# Patient Record
Sex: Female | Born: 1995 | Race: Black or African American | Hispanic: No | Marital: Single | Smoking: Former smoker
Health system: Southern US, Community
[De-identification: ages and names within clinical notes are randomized; demographics above are authoritative.]

## PROBLEM LIST (undated history)

## (undated) ENCOUNTER — Ambulatory Visit: Admission: EM | Source: Home / Self Care

## (undated) DIAGNOSIS — R011 Cardiac murmur, unspecified: Secondary | ICD-10-CM

## (undated) DIAGNOSIS — E0590 Thyrotoxicosis, unspecified without thyrotoxic crisis or storm: Secondary | ICD-10-CM

## (undated) DIAGNOSIS — F419 Anxiety disorder, unspecified: Secondary | ICD-10-CM

## (undated) DIAGNOSIS — J189 Pneumonia, unspecified organism: Secondary | ICD-10-CM

## (undated) DIAGNOSIS — D649 Anemia, unspecified: Secondary | ICD-10-CM

## (undated) HISTORY — DX: Cardiac murmur, unspecified: R01.1

## (undated) HISTORY — DX: Pneumonia, unspecified organism: J18.9

## (undated) HISTORY — DX: Thyrotoxicosis, unspecified without thyrotoxic crisis or storm: E05.90

## (undated) HISTORY — DX: Anxiety disorder, unspecified: F41.9

## (undated) HISTORY — DX: Anemia, unspecified: D64.9

---

## 2019-07-14 ENCOUNTER — Ambulatory Visit: Payer: Medicaid Other | Admitting: Certified Nurse Midwife

## 2019-08-04 ENCOUNTER — Ambulatory Visit: Payer: Medicaid Other | Admitting: Medical

## 2019-09-14 ENCOUNTER — Other Ambulatory Visit: Payer: Self-pay

## 2019-09-14 ENCOUNTER — Encounter: Payer: Self-pay | Admitting: Advanced Practice Midwife

## 2019-09-14 ENCOUNTER — Ambulatory Visit (INDEPENDENT_AMBULATORY_CARE_PROVIDER_SITE_OTHER): Payer: Medicaid Other | Admitting: Advanced Practice Midwife

## 2019-09-14 ENCOUNTER — Other Ambulatory Visit (HOSPITAL_COMMUNITY)
Admission: RE | Admit: 2019-09-14 | Discharge: 2019-09-14 | Disposition: A | Discharge status: Home / Self Care | Payer: Medicaid Other | Source: Ambulatory Visit | Attending: Advanced Practice Midwife | Admitting: Advanced Practice Midwife

## 2019-09-14 VITALS — BP 118/64 | HR 73 | Temp 98.3°F | Ht 64.0 in | Wt 134.0 lb

## 2019-09-14 DIAGNOSIS — Z0000 Encounter for general adult medical examination without abnormal findings: Secondary | ICD-10-CM | POA: Diagnosis not present

## 2019-09-14 DIAGNOSIS — R102 Pelvic and perineal pain unspecified side: Secondary | ICD-10-CM | POA: Insufficient documentation

## 2019-09-14 DIAGNOSIS — Z113 Encounter for screening for infections with a predominantly sexual mode of transmission: Secondary | ICD-10-CM | POA: Diagnosis not present

## 2019-09-14 DIAGNOSIS — Z975 Presence of (intrauterine) contraceptive device: Secondary | ICD-10-CM | POA: Insufficient documentation

## 2019-09-14 DIAGNOSIS — N939 Abnormal uterine and vaginal bleeding, unspecified: Secondary | ICD-10-CM

## 2019-09-14 DIAGNOSIS — N921 Excessive and frequent menstruation with irregular cycle: Secondary | ICD-10-CM | POA: Insufficient documentation

## 2019-09-14 DIAGNOSIS — Z3009 Encounter for other general counseling and advice on contraception: Secondary | ICD-10-CM

## 2019-09-14 MED ORDER — NORGESTIMATE-ETH ESTRADIOL 0.25-35 MG-MCG PO TABS: 1.0000 | ORAL_TABLET | Freq: Every day | ORAL | 2 refills | Status: DC

## 2019-09-14 NOTE — Progress Notes (Signed)
Subjective:     Brianna Lewis is a 23 y.o. female here for a routine exam.  Current complaints: abnormal bleeding x 3 months with bleeding daily x 1 month. Bleeding is not heavy most days but is constant, requiring tampons daily.  This is associated with vaginal irritation. She desires STD screening today but denies any known risks.  She has Nexplanon in place since 2017, due to be replaced in 2 months.  She has used Nexplanon for contraception x 6 years.  She reports one prior episode of AUB, resolved with OCPs. She reports some intermittent abdominal cramping and some intermittent body aches x 2-3 months associated with the abnormal bleeding. She denies any fever/chills/sore throat/change in taste or smell or exposure to COVID.      Gynecologic History Patient's last menstrual period was 08/14/2019. Contraception: Nexplanon Last Pap: n/a due to young age.  Obstetric History OB History  Gravida Para Term Preterm AB Living  0 0 0 0 0 0  SAB TAB Ectopic Multiple Live Births  0 0 0 0 0     The following portions of the patient's history were reviewed and updated as appropriate: allergies, current medications, past family history, past medical history, past social history, past surgical history and problem list.  Review of Systems Pertinent items noted in HPI and remainder of comprehensive ROS otherwise negative.    Objective:   BP 118/64   Pulse 73   Temp 98.3 F (36.8 C)   Ht 5\' 4"  (1.626 m)   Wt 134 lb (60.8 kg)   LMP 08/14/2019   BMI 23.00 kg/m   VS reviewed, nursing note reviewed,  Constitutional: well developed, well nourished, no distress HEENT: normocephalic CV: normal rate Pulm/chest wall: normal effort Breast Exam:  right breast normal without mass, skin or nipple changes or axillary nodes, left breast normal without mass, skin or nipple changes or axillary nodes Abdomen: soft Neuro: alert and oriented x 3 Skin: warm, dry Psych: affect normal Pelvic exam: SSE  deferred Bimanual exam: Cervix 0/long/high, firm, anterior, neg CMT, uterus nontender, nonenlarged, adnexa without tenderness, enlargement, or mass      Assessment/Plan:   1. Abnormal uterine bleeding --Likely r/t Nexplanon but pt with adnexal pain, with normal bimanual exam.  Will order outpatient [place] for evaluation. - US PELVIC COMPLETE WITH TRANSVAGINAL; Future - norgestimate-ethinyl estradiol (ORTHO-CYCLEN) 0.25-35 MG-MCG tablet; Take 1 tablet by mouth daily.  Dispense: 1 Package; Refill: 2  2. Screening examination for STD (sexually transmitted disease) - Cervicovaginal ancillary only( [name]) - HepB+HepC+HIV Panel - RPR - HIV antibody (with reflex)  3. Encounter for counseling regarding contraception --Pt happy with Nexplanon. Discussed UD as alternative, possibly with less bleeding.  Pt unsure. Will consider if OCPs do not resolve symptoms.   4. Breakthrough bleeding on Nexplanon --Most likely cause of AUB is Nexplanon.  Will treat with OCPs x 1-3 months. Pt to f/u in office in 2 months.   --Pt smoker, no other risk factors for DVT/PE.  Short course of OCPs pose minimal risk but s/sx/reasons to seek care discussed with pt today.  Contraception: Nexplanon  Follow up in: 2 months or as needed.   Fatima Blank, CNM 3:58 PM

## 2019-09-14 NOTE — Progress Notes (Signed)
New GYN presents for AEX.  C/o vaginal bleeding since 08/14/19, lower abdominal pain 7-9/10 x 3 months.  Denies discharge, fever, chills. Has Nexplanon for [facility].  PHQ-9=11

## 2019-09-14 NOTE — Progress Notes (Deleted)
Subjective:     Brianna Lewis is a 23 y.o. female here for a routine exam.  Current complaints: ***.    Normal/last known normal Describe current cycles/when did they change? Pain w/cycles?  Sexual hx: Active? Form of contraception History of std Okay screening? Pain w/?  Personal health questionnaire reviewed: yes.  Do you have a primary care provider? *** How many times per week do you exercise? *** Do you feel safe at home? *** Has anyone hit, slapped, or kicked you recently? *** Do you feel sad, tired, or upset most days or are you mostly happy with life? ***  Gynecologic History Patient's last menstrual period was 08/14/2019. Contraception: {TXMIWO:0321}  Not applicable Last Pap: ***. Results were: {norm/abn:16337} Last mammogram: ***. Results were: {norm/abn:16337}  Obstetric History OB History  No obstetric history on file.      {Common ambulatory SmartLinks:19316}  Review of Systems {ros; complete:30496}    Objective:    {exam; complete:18323}    Assessment:    Healthy female exam.    Plan:    {plan:19193}

## 2019-09-16 LAB — CERVICOVAGINAL ANCILLARY ONLY
Bacterial vaginitis: POSITIVE — AB
Candida vaginitis: POSITIVE — AB
Chlamydia: POSITIVE — AB
Neisseria Gonorrhea: NEGATIVE
Trichomonas: NEGATIVE

## 2019-09-16 LAB — HEPB+HEPC+HIV PANEL
HIV Screen 4th Generation wRfx: NONREACTIVE
Hep B C IgM: NEGATIVE
Hep B Core Total Ab: NEGATIVE
Hep B E Ab: NEGATIVE
Hep B E Ag: NEGATIVE
Hep B Surface Ab, Qual: NONREACTIVE
Hep C Virus Ab: <0.1 s/co ratio (ref 0.0–0.9)
Hepatitis B Surface Ag: NEGATIVE

## 2019-09-16 LAB — RPR: RPR Ser Ql: NONREACTIVE

## 2019-09-21 ENCOUNTER — Other Ambulatory Visit: Payer: Self-pay | Admitting: Advanced Practice Midwife

## 2019-09-21 ENCOUNTER — Telehealth: Payer: Self-pay

## 2019-09-21 DIAGNOSIS — B373 Candidiasis of vulva and vagina: Secondary | ICD-10-CM

## 2019-09-21 DIAGNOSIS — A749 Chlamydial infection, unspecified: Secondary | ICD-10-CM

## 2019-09-21 DIAGNOSIS — B3731 Acute candidiasis of vulva and vagina: Secondary | ICD-10-CM

## 2019-09-21 MED ORDER — AZITHROMYCIN 500 MG PO TABS: 1000.0000 mg | ORAL_TABLET | Freq: Once | ORAL | 1 refills | Status: AC

## 2019-09-21 MED ORDER — FLUCONAZOLE 150 MG PO TABS: 150.0000 mg | ORAL_TABLET | Freq: Once | ORAL | 0 refills | Status: AC

## 2019-09-21 NOTE — Telephone Encounter (Signed)
TC to pt to make aware of results  Pt notifed and voiced understanding Pt made aware No unproctected intercourse at this time and will need TOC appt scheduled Pt voiced understanding and states she has no questions. STD form faxed to [facility]

## 2019-09-21 NOTE — Telephone Encounter (Signed)
-----   Message from Elvera Maria, CNM sent at 09/21/2019  1:13 PM EDT ----- Regarding: positive chlamydia test This nonpregnant pt was seen for abnormal bleeding with Nexplanon. She is positive for chlamydia. She also has yeast.  This may be contributing to her irregular bleeding.  I sent azithromycin 1000 mg and Diflucan 150 mg to her pharmacy.  Her partner(s) will need treatment for chlamydia also.  Please call to let her know. Thank you.

## 2019-09-29 ENCOUNTER — Other Ambulatory Visit: Payer: Self-pay

## 2019-09-29 ENCOUNTER — Ambulatory Visit (HOSPITAL_COMMUNITY)
Admission: RE | Admit: 2019-09-29 | Discharge: 2019-09-29 | Disposition: A | Discharge status: Home / Self Care | Payer: Medicaid Other | Source: Ambulatory Visit | Attending: Advanced Practice Midwife | Admitting: Advanced Practice Midwife

## 2019-09-29 DIAGNOSIS — N939 Abnormal uterine and vaginal bleeding, unspecified: Secondary | ICD-10-CM | POA: Diagnosis present

## 2019-11-16 ENCOUNTER — Ambulatory Visit (INDEPENDENT_AMBULATORY_CARE_PROVIDER_SITE_OTHER): Payer: Medicaid Other | Admitting: Advanced Practice Midwife

## 2019-11-16 ENCOUNTER — Other Ambulatory Visit: Payer: Self-pay

## 2019-11-16 VITALS — BP 130/78 | HR 101 | Wt 140.0 lb

## 2019-11-16 DIAGNOSIS — Z30017 Encounter for initial prescription of implantable subdermal contraceptive: Secondary | ICD-10-CM

## 2019-11-16 DIAGNOSIS — Z3046 Encounter for surveillance of implantable subdermal contraceptive: Secondary | ICD-10-CM

## 2019-11-16 MED ORDER — ETONOGESTREL 68 MG ~~LOC~~ IMPL
68.0000 mg | DRUG_IMPLANT | Freq: Once | SUBCUTANEOUS | Status: AC
  Administered 2019-11-16: 68 mg via SUBCUTANEOUS

## 2019-11-16 NOTE — Progress Notes (Signed)
GYNECOLOGY CLINIC PROCEDURE NOTE  Brianna Lewis is a 23 y.o. G0P0000 here for Nexplanon removal and Nexplanon insertion.  Pt had visit on 09/14/19 with breakthrough bleeding and took short course of OCPs. Bleeding resolved and she desires to continue Nexplanon for contraception.  No other gynecologic concerns.  Nexplanon Removal and Insertion  Patient identified, informed consent performed, consent signed.   Patient does understand that irregular bleeding is a very common side effect of this medication. She was advised to have backup contraception for one week after replacement of the implant. Pregnancy test in clinic today was negative.  Appropriate time out taken. Implanon site identified. Area prepped in usual sterile fashon. One ml of 1% lidocaine was used to anesthetize the area at the distal end of the implant. A small stab incision was made right beside the implant on the distal portion. The Nexplanon rod was grasped using hemostats and removed without difficulty. There was minimal blood loss. There were no complications. Area was then injected with 3 ml of 1 % lidocaine. She was re-prepped with betadine, Nexplanon removed from packaging, Device confirmed in needle, then inserted full length of needle and withdrawn per handbook instructions. Nexplanon was able to palpated in the patient's arm; patient palpated the insert herself.  There was minimal blood loss. Patient insertion site covered with guaze and a pressure bandage to reduce any bruising. The patient tolerated the procedure well and was given post procedure instructions.  She was advised to have backup contraception for one week.    Exp:  Nov 28, 20200 Lot 1:  [id number]  Fatima Blank, CNM 4:57 PM

## 2019-11-23 ENCOUNTER — Other Ambulatory Visit: Payer: Self-pay | Admitting: Advanced Practice Midwife

## 2019-11-23 DIAGNOSIS — N939 Abnormal uterine and vaginal bleeding, unspecified: Secondary | ICD-10-CM

## 2019-11-23 DIAGNOSIS — Z975 Presence of (intrauterine) contraceptive device: Secondary | ICD-10-CM

## 2019-11-23 DIAGNOSIS — N921 Excessive and frequent menstruation with irregular cycle: Secondary | ICD-10-CM

## 2019-11-23 MED ORDER — NORGESTIMATE-ETH ESTRADIOL 0.25-35 MG-MCG PO TABS: 1.0000 | ORAL_TABLET | Freq: Every day | ORAL | 2 refills | Status: DC

## 2019-12-01 ENCOUNTER — Telehealth: Payer: Self-pay | Admitting: Advanced Practice Midwife

## 2019-12-01 DIAGNOSIS — N898 Other specified noninflammatory disorders of vagina: Secondary | ICD-10-CM

## 2019-12-01 DIAGNOSIS — Z202 Contact with and (suspected) exposure to infections with a predominantly sexual mode of transmission: Secondary | ICD-10-CM

## 2019-12-01 MED ORDER — FLUCONAZOLE 150 MG PO TABS: 150.0000 mg | ORAL_TABLET | Freq: Once | ORAL | 0 refills | Status: AC

## 2019-12-01 MED ORDER — AZITHROMYCIN 500 MG PO TABS: 1000.0000 mg | ORAL_TABLET | Freq: Once | ORAL | 1 refills | Status: AC

## 2019-12-01 MED ORDER — TERCONAZOLE 0.4 % VA CREA: 1.0000 | TOPICAL_CREAM | Freq: Every day | VAGINAL | 0 refills | Status: DC

## 2019-12-01 NOTE — Telephone Encounter (Signed)
duplicate

## 2020-01-28 ENCOUNTER — Ambulatory Visit: Payer: Medicaid Other

## 2020-02-02 ENCOUNTER — Ambulatory Visit: Payer: Medicaid Other

## 2020-09-23 ENCOUNTER — Encounter: Payer: Self-pay | Admitting: Advanced Practice Midwife

## 2020-09-23 ENCOUNTER — Other Ambulatory Visit: Payer: Self-pay

## 2020-09-23 ENCOUNTER — Other Ambulatory Visit (HOSPITAL_COMMUNITY)
Admission: RE | Admit: 2020-09-23 | Discharge: 2020-09-23 | Disposition: A | Discharge status: Home / Self Care | Payer: Medicaid Other | Source: Ambulatory Visit | Attending: Advanced Practice Midwife | Admitting: Advanced Practice Midwife

## 2020-09-23 ENCOUNTER — Ambulatory Visit (INDEPENDENT_AMBULATORY_CARE_PROVIDER_SITE_OTHER): Payer: Medicaid Other | Admitting: Advanced Practice Midwife

## 2020-09-23 VITALS — BP 134/73 | HR 73 | Wt 146.0 lb

## 2020-09-23 DIAGNOSIS — N898 Other specified noninflammatory disorders of vagina: Secondary | ICD-10-CM | POA: Diagnosis present

## 2020-09-23 DIAGNOSIS — R102 Pelvic and perineal pain: Secondary | ICD-10-CM | POA: Diagnosis present

## 2020-09-23 DIAGNOSIS — Z01419 Encounter for gynecological examination (general) (routine) without abnormal findings: Secondary | ICD-10-CM | POA: Diagnosis not present

## 2020-09-23 DIAGNOSIS — Z113 Encounter for screening for infections with a predominantly sexual mode of transmission: Secondary | ICD-10-CM | POA: Diagnosis present

## 2020-09-23 DIAGNOSIS — B373 Candidiasis of vulva and vagina: Secondary | ICD-10-CM

## 2020-09-23 DIAGNOSIS — Z975 Presence of (intrauterine) contraceptive device: Secondary | ICD-10-CM

## 2020-09-23 DIAGNOSIS — B3731 Acute candidiasis of vulva and vagina: Secondary | ICD-10-CM

## 2020-09-23 MED ORDER — FLUCONAZOLE 150 MG PO TABS: 150.0000 mg | ORAL_TABLET | Freq: Once | ORAL | 0 refills | Status: AC

## 2020-09-23 NOTE — Progress Notes (Signed)
Subjective:     Trixy Loyola is a 24 y.o. female here at [facility] for a routine exam.  Current complaints: vaginal itching with white discharge, pain sometimes during intercourse, cramping menstrual pain not related to menses and irregular but frequent light bleeding with Nexplanon in place.  Personal health questionnaire reviewed: yes.  Do you have a primary care provider? yes Do you feel safe at home? yes    Office Visit from 09/23/2020 in [facility]  PHQ-2 Total Score 2       Gynecologic History Patient's last menstrual period was 09/02/2020 (approximate). Contraception: Nexplanon Last Pap: n/a.  Last mammogram: n/a  Obstetric History OB History  Gravida Para Term Preterm AB Living  0 0 0 0 0 0  SAB TAB Ectopic Multiple Live Births  0 0 0 0 0     The following portions of the patient's history were reviewed and updated as appropriate: allergies, current medications, past family history, past medical history, past social history, past surgical history and problem list.  Review of Systems Pertinent items noted in HPI and remainder of comprehensive ROS otherwise negative.    Objective:   BP 134/73   Pulse 73   Wt 146 lb (66.2 kg)   LMP 09/02/2020 (Approximate)   BMI 25.06 kg/m  VS reviewed, nursing note reviewed,  Constitutional: well developed, well nourished, no distress HEENT: normocephalic CV: normal rate Pulm/chest wall: normal effort Breast Exam:  Deferred with low risks and shared decision making, discussed recommendation to start mammogram between 61-50 yo Abdomen: soft Neuro: alert and oriented x 3 Skin: warm, dry Psych: affect normal Pelvic exam:Performed: Cervix pink, visually closed, without lesion, moderate amount thick white discharge, vaginal walls and external genitalia normal with some mild erythema of labia minora/perineum and single tiny microtear at perineum that is painful to palpation Bimanual exam: Cervix  0/long/high, firm, anterior, neg CMT, uterus nontender, nonenlarged, adnexa without tenderness, enlargement, or mass       Assessment/Plan:  1. Screen for STD (sexually transmitted disease) - Cervicovaginal ancillary only( [place]) - HIV antibody (with reflex) - RPR - Hepatitis C antibody - Hepatitis B surface antigen  2. Well woman exam with routine gynecological exam --screening for cervical cancer today, pt has never had Pap, now age 47 - Cytology - PAP( [place])  3. Vaginal discharge  - Cervicovaginal ancillary only( [place])  4. Itching in the vaginal area  - Cervicovaginal ancillary only( [place])  5. Pelvic pain in female --Irregular cramping and irregular bleeding with Nexplanon.  Pain may be related to vaginal infection or Nexplanon.  Will treat yeast, await STD results, and pt encouraged to use NSAIDs for pain. --F/U in 3 months - Cervicovaginal ancillary only( [place])  6. Nexplanon in place   7. Vaginal pain --Pt with small tear on perineal area, painful to touch only. - Herpes simplex virus culture  8. Vaginal candidiasis --Clinical exam with evidence of yeast and pt with itching.  Will initiate treatment today with Diflucan, labs to result over the weekend. - fluconazole (DIFLUCAN) 150 MG tablet; Take 1 tablet (150 mg total) by mouth once for 1 dose.  Dispense: 1 tablet; Refill: 0    Follow up in: 3 months or as needed.   Sharen Counter, CNM 11:13 AM

## 2020-09-23 NOTE — Patient Instructions (Signed)

## 2020-09-23 NOTE — Progress Notes (Signed)
GYN presents for AEX/PAP/STD Screening. C/o pelvic pains/cramps 6-7/10, tenderness in breast, vaginal discharge, itching, irritation inside the vagina during sex.  PHQ-9=5

## 2020-09-24 LAB — RPR: RPR Ser Ql: NONREACTIVE

## 2020-09-24 LAB — HEPATITIS C ANTIBODY: Hep C Virus Ab: <0.1 s/co ratio (ref 0.0–0.9)

## 2020-09-24 LAB — HIV ANTIBODY (ROUTINE TESTING W REFLEX): HIV Screen 4th Generation wRfx: NONREACTIVE

## 2020-09-24 LAB — HEPATITIS B SURFACE ANTIGEN: Hepatitis B Surface Ag: NEGATIVE

## 2020-09-26 LAB — CERVICOVAGINAL ANCILLARY ONLY
Bacterial Vaginitis (gardnerella): POSITIVE — AB
Candida Glabrata: NEGATIVE
Candida Vaginitis: POSITIVE — AB
Chlamydia: NEGATIVE
Comment: NEGATIVE
Comment: NEGATIVE
Comment: NEGATIVE
Comment: NEGATIVE
Comment: NEGATIVE
Comment: NORMAL
Neisseria Gonorrhea: NEGATIVE
Trichomonas: NEGATIVE

## 2020-09-26 LAB — HERPES SIMPLEX VIRUS CULTURE

## 2020-09-27 ENCOUNTER — Encounter: Payer: Self-pay | Admitting: Advanced Practice Midwife

## 2020-09-27 DIAGNOSIS — R87610 Atypical squamous cells of undetermined significance on cytologic smear of cervix (ASC-US): Secondary | ICD-10-CM | POA: Insufficient documentation

## 2020-09-27 LAB — CYTOLOGY - PAP
Comment: NEGATIVE
Diagnosis: UNDETERMINED — AB
High risk HPV: NEGATIVE

## 2020-12-05 IMAGING — US US PELVIS COMPLETE WITH TRANSVAGINAL
1 series · 15 of 25 positions shown · non-contrast
Comparison: None

CLINICAL DATA: Abnormal uterine bleeding, adnexal pain



[Series 1: us pelvis complete with transvaginal · 15 of 56 slices shown]
[im 1/56]
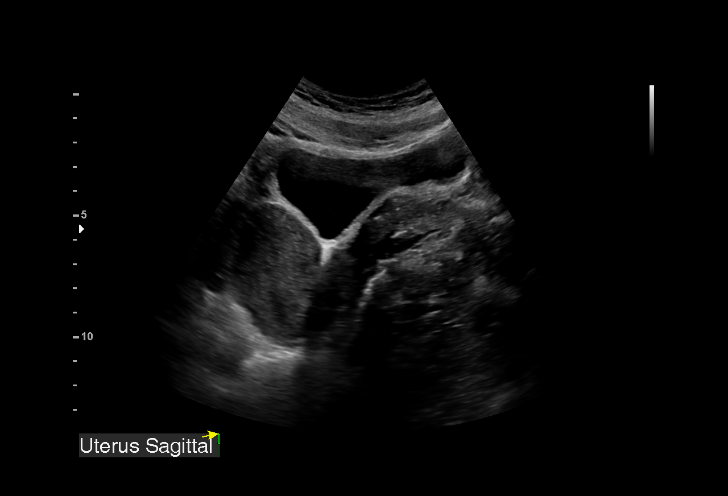
[im 5/56]
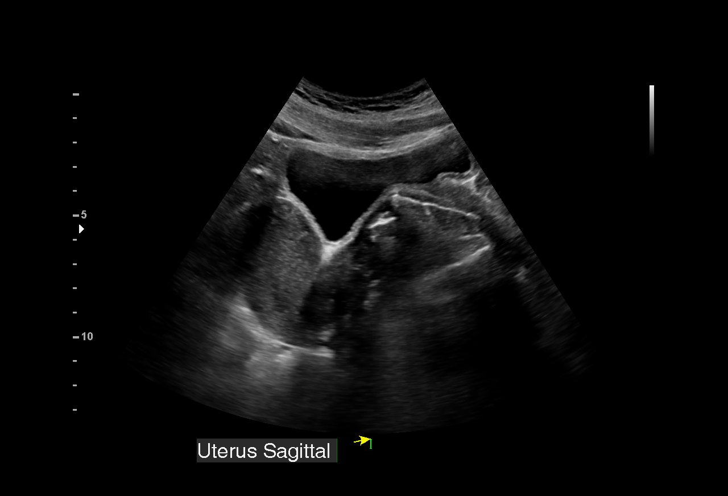
[im 10/56]
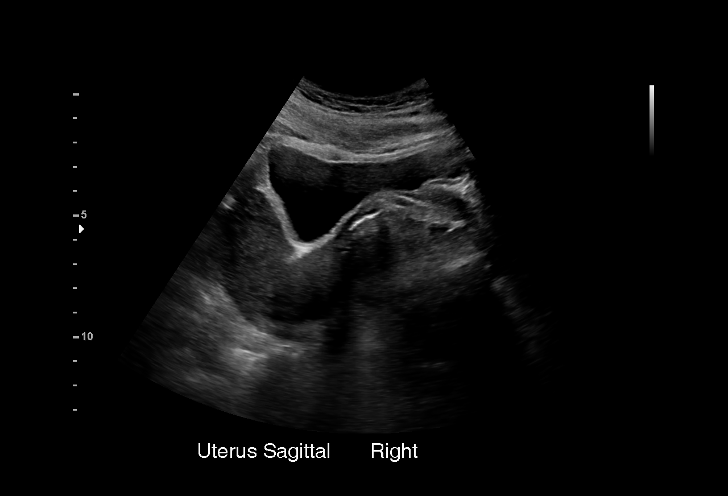
[im 12/56]
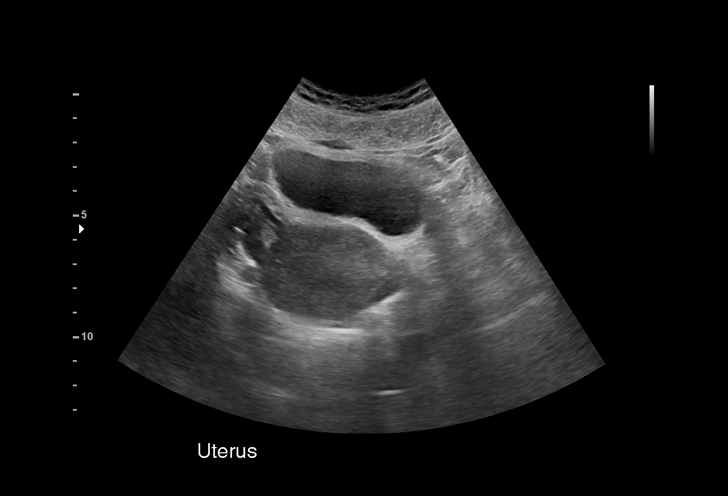
[im 17/56]
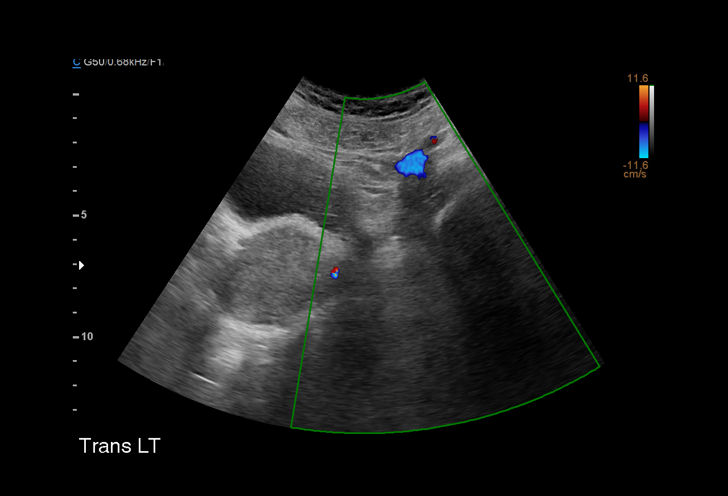
[im 21/56]
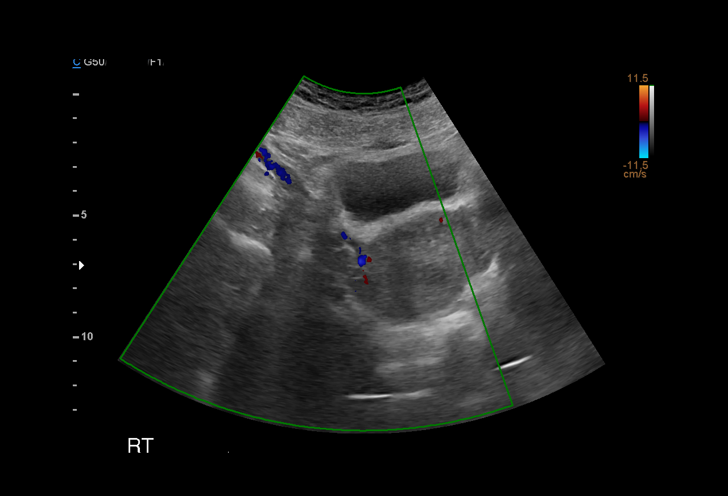
[im 23/56]
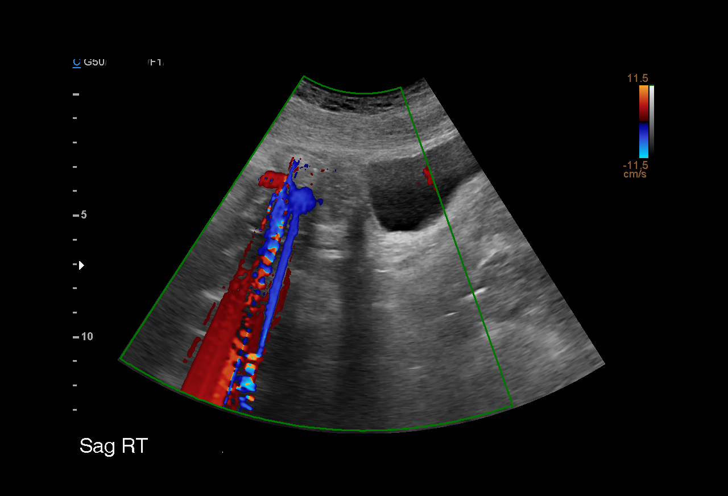
[im 28/56]
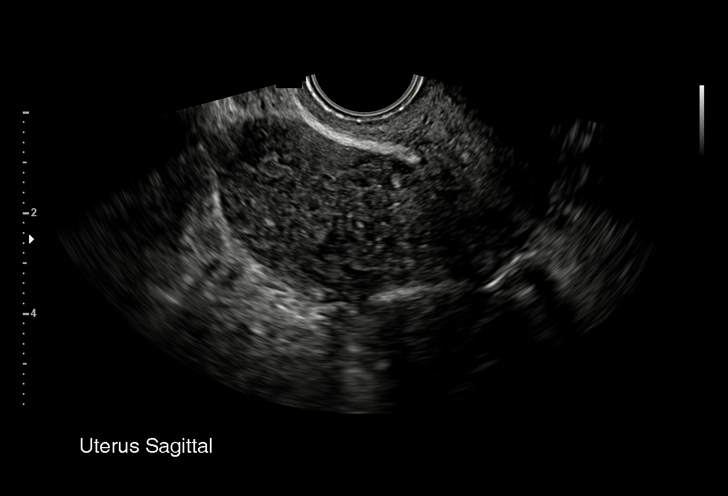
[im 33/56]
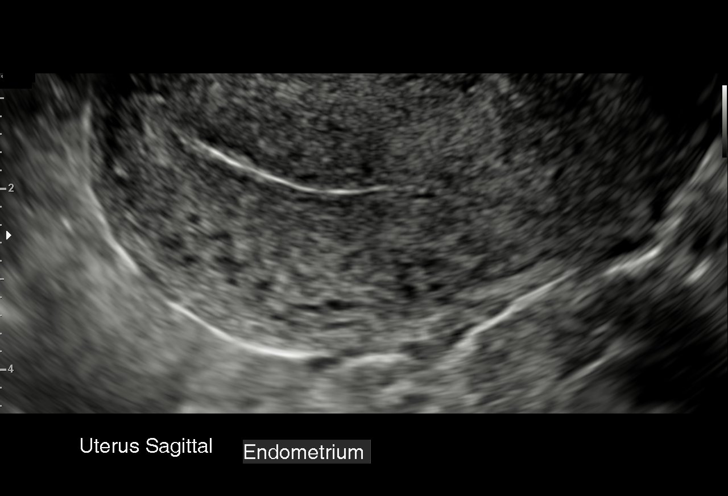
[im 35/56]
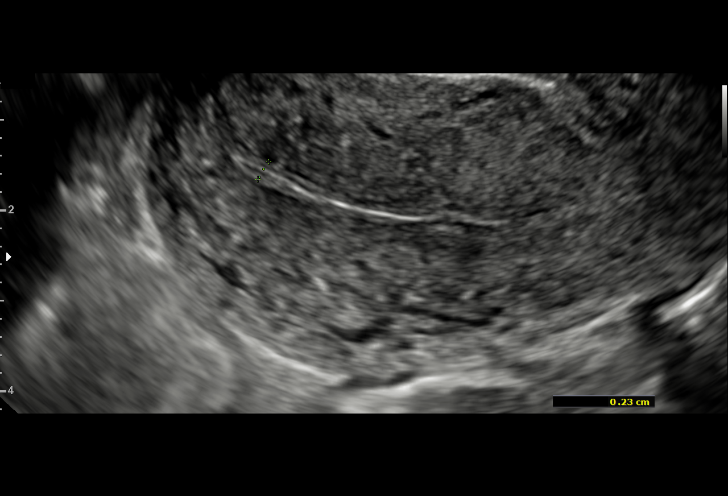
[im 39/56]
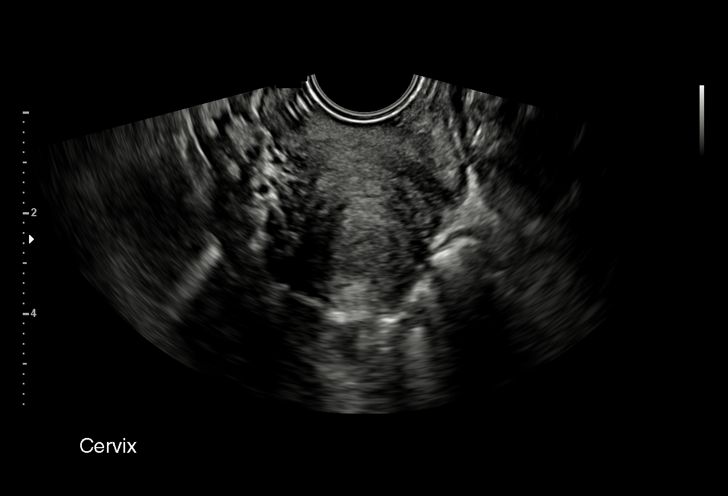
[im 44/56]
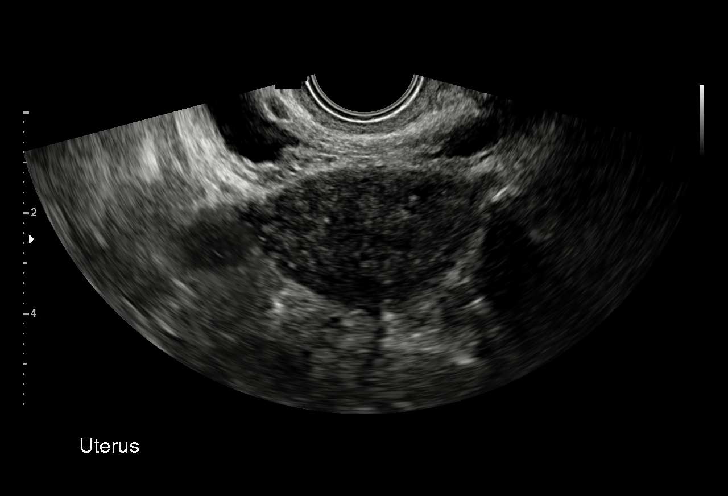
[im 46/56]
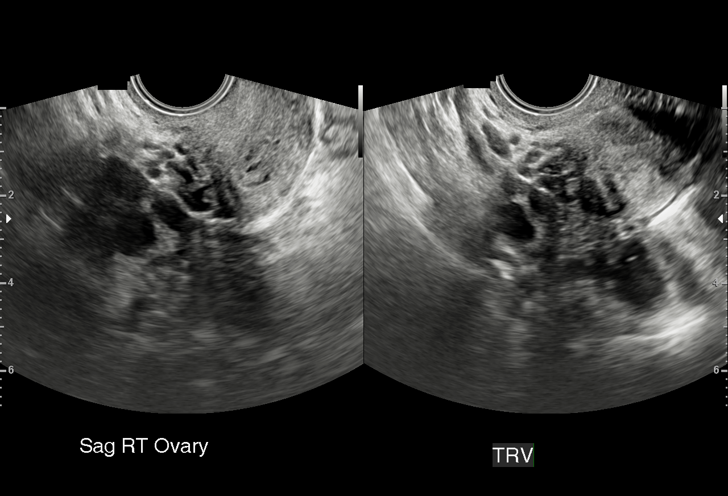
[im 51/56]
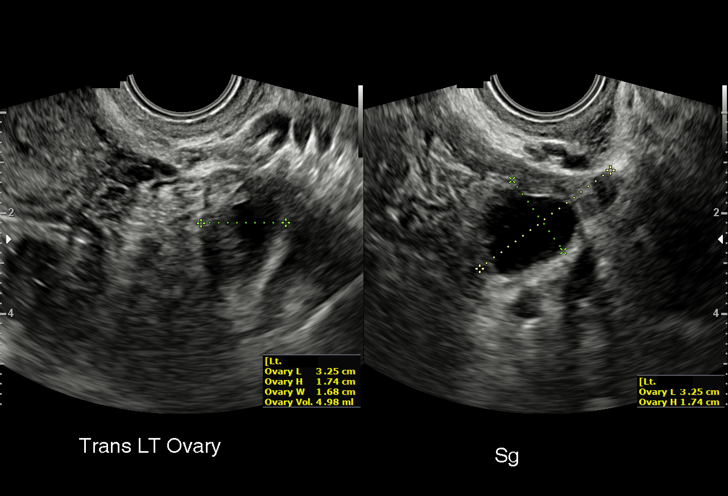
[im 56/56]
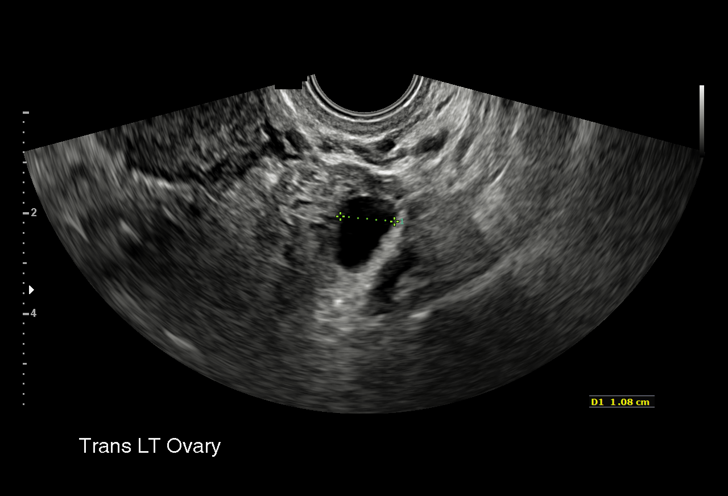

[15 of 25 positions shown; findings below may reference images not displayed]

FINDINGS: Uterus

Measurements: 6.8 x 3.4 x 4.4 cm = volume: 53 mL. No fibroids or
other mass visualized.

Endometrium

Thickness: 2 mm in thickness.  No focal abnormality visualized.

Right ovary

Measurements: 2.7 x 1.9 x 1.7 cm = volume: 4.7 mL. Normal
appearance/no adnexal mass.

Left ovary

Measurements: 3.3 x 1.7 x 1.7 cm = volume: 5.0 mL. Normal
appearance/no adnexal mass.

Other findings

No abnormal free fluid.
IMPRESSION: Unremarkable pelvic ultrasound.

## 2021-01-09 ENCOUNTER — Ambulatory Visit: Payer: Medicaid Other | Admitting: Advanced Practice Midwife

## 2021-03-09 ENCOUNTER — Ambulatory Visit: Payer: Self-pay | Admitting: *Deleted

## 2021-03-09 NOTE — Telephone Encounter (Signed)
Pt has toothache onset Saturday. Right sided, cannot tell which tooth. States pain is better, but fluctuates. No swelling, no fever. Questioning what she can do for pain Until appt 03/31/21. States pain fluctuates, "Not bad today".  Care advise per protocol, pt verbalizes understanding. Advised UC if fever, swelling occurs. Reason for Disposition . Toothache present > 24 hours . Toothache present < 24 hours  Answer Assessment - Initial Assessment Questions 1. LOCATION: "Which tooth is hurting?"  (e.g., right-side/left-side, upper/lower, front/back)     Unsure, right side of face hurting 2. ONSET: "When did the toothache start?"  (e.g., hours, days)      SAturday 3. SEVERITY: "How bad is the toothache?"  (Scale 1-10; mild, moderate or severe)   - MILD (1-3): doesn't interfere with chewing    - MODERATE (4-7): interferes with chewing, interferes with normal activities, awakens from sleep     - SEVERE (8-10): unable to eat, unable to do any normal activities, excruciating pain        Fluctuates 4. SWELLING: "Is there any visible swelling of your face?"     Mild 5. OTHER SYMPTOMS: "Do you have any other symptoms?" (e.g., fever)     No 6. PREGNANCY: "Is there any chance you are pregnant?" "When was your last menstrual period?"  Protocols used: TOOTHACHE-A-AH

## 2021-06-27 ENCOUNTER — Telehealth: Payer: Medicaid Other | Admitting: Advanced Practice Midwife

## 2021-06-27 ENCOUNTER — Encounter: Payer: Self-pay | Admitting: *Deleted

## 2021-06-27 NOTE — Progress Notes (Signed)
No answer for today's virtual appt.  Pt not seen by RN or provider.

## 2021-07-02 ENCOUNTER — Other Ambulatory Visit: Payer: Self-pay

## 2021-07-02 DIAGNOSIS — N898 Other specified noninflammatory disorders of vagina: Secondary | ICD-10-CM

## 2021-07-06 ENCOUNTER — Telehealth (INDEPENDENT_AMBULATORY_CARE_PROVIDER_SITE_OTHER): Payer: Medicaid Other | Admitting: Advanced Practice Midwife

## 2021-07-06 DIAGNOSIS — Z975 Presence of (intrauterine) contraceptive device: Secondary | ICD-10-CM | POA: Diagnosis not present

## 2021-07-06 DIAGNOSIS — N898 Other specified noninflammatory disorders of vagina: Secondary | ICD-10-CM

## 2021-07-06 DIAGNOSIS — N921 Excessive and frequent menstruation with irregular cycle: Secondary | ICD-10-CM | POA: Diagnosis not present

## 2021-07-06 DIAGNOSIS — R87610 Atypical squamous cells of undetermined significance on cytologic smear of cervix (ASC-US): Secondary | ICD-10-CM

## 2021-07-06 MED ORDER — NORGESTIMATE-ETH ESTRADIOL 0.25-35 MG-MCG PO TABS: 1.0000 | ORAL_TABLET | Freq: Every day | ORAL | 2 refills | Status: DC

## 2021-07-06 MED ORDER — TERCONAZOLE 0.8 % VA CREA: 1.0000 | TOPICAL_CREAM | Freq: Every day | VAGINAL | 1 refills | Status: AC

## 2021-07-06 NOTE — Progress Notes (Signed)
Pt c/o of itching and white discharge

## 2021-07-06 NOTE — Progress Notes (Signed)
    GYNECOLOGY VIRTUAL VISIT ENCOUNTER NOTE  Provider location: [facility]   Patient location: Home  I connected with Brianna Lewis on 07/06/21 at  3:40 PM EDT by MyChart Video Encounter and verified that I am speaking with the correct person using two identifiers.   I discussed the limitations, risks, security and privacy concerns of performing an evaluation and management service virtually and the availability of in person appointments. I also discussed with the patient that there may be a patient responsible charge related to this service. The patient expressed understanding and agreed to proceed.   History:  Brianna Lewis is a 25 y.o. G0P0000 female being evaluated today for irregular bleeding. Periods are irregular with bleeding 2-3 weeks every day at times.  She had a period 2 weeks ago that resolved in 4-5 days and is not bleeding now. Since the period, however, she has thick whtie discharge with itching, similar to previous yeast infections.       Past Medical History:  Diagnosis Date   Anemia    Anxiety    Heart murmur    Hyperthyroidism    Pneumonia    No past surgical history on file. The following portions of the patient's history were reviewed and updated as appropriate: allergies, current medications, past family history, past medical history, past social history, past surgical history and problem list.   Health Maintenance:  ASCUS with negative HPV on 09/23/20, f/u in 2024.  Review of Systems:  Pertinent items noted in HPI and remainder of comprehensive ROS otherwise negative.  Physical Exam:   General:  Alert, oriented and cooperative. Patient appears to be in no acute distress.  Mental Status: Normal mood and affect. Normal behavior. Normal judgment and thought content.   Respiratory: Normal respiratory effort, no problems with respiration noted  Rest of physical exam deferred due to type of encounter  Labs and Imaging No results  found for this or any previous visit (from the past 336 hour(s)). No results found.     Assessment and Plan:     1. Vagina itching --Symptoms c/w with previous yeast infections. --Will treat empirically for yeast, pt to f/u if symptoms do not resolve --Pt prefers topical treatment - terconazole (TERAZOL 3) 0.8 % vaginal cream; Place 1 applicator vaginally at bedtime.  Dispense: 20 g; Refill: 1  2. Breakthrough bleeding on Nexplanon --Pt initially made appt for irregular bleeding but this has been improved in the last 2 months.  Rx for OCPs to use 1 pack PRN for frequent bleeding.   -F/U as needed --F/U in 1 year for Nexplanon replacement or to discuss other options for contraception - norgestimate-ethinyl estradiol (ORTHO-CYCLEN) 0.25-35 MG-MCG tablet; Take 1 tablet by mouth daily.  Dispense: 28 tablet; Refill: 2       I discussed the assessment and treatment plan with the patient. The patient was provided an opportunity to ask questions and all were answered. The patient agreed with the plan and demonstrated an understanding of the instructions.   The patient was advised to call back or seek an in-person evaluation/go to the ED if the symptoms worsen or if the condition fails to improve as anticipated.  I provided 10 minutes of face-to-face time during this encounter.   Sharen Counter, CNM [facility] [employer], [facility]

## 2021-07-11 MED ORDER — TERCONAZOLE 0.4 % VA CREA: 1.0000 | TOPICAL_CREAM | Freq: Every day | VAGINAL | 0 refills | Status: DC

## 2021-10-23 ENCOUNTER — Ambulatory Visit: Payer: Self-pay

## 2021-10-23 NOTE — Telephone Encounter (Signed)
Pt called was lightheaded last night. Nausea and vomiting his am x 1 . Sleeping all day and was fever and headache.  Feels better after eating soup. Feels "under the weather." Pt stated she was still not sure if home test was positive. Advised pt to retest. Pt concerned sx could be the flu. Advised pt to call her PCP for advice.  Discussed quarantine and when to call doctor ir call 911. Pt verbalized understanding.       Reason for Disposition  [1] COVID-19 infection suspected by caller or triager AND [2] mild symptoms (cough, fever, or others) AND [3] has not gotten tested yet    Pt tested had faint line not sure if was positive  Answer Assessment - Initial Assessment Questions 1. COVID-19 DIAGNOSIS: "Who made your COVID-19 diagnosis?" "Was it confirmed by a positive lab test or self-test?" If not diagnosed by a doctor (or NP/PA), ask "Are there lots of cases (community spread) where you live?" Note: See public health department website, if unsure.     Self test-yes 2. COVID-19 EXPOSURE: "Was there any known exposure to COVID before the symptoms began?" CDC Definition of close contact: within 6 feet (2 meters) for a total of 15 minutes or more over a 24-hour period.      no 3. ONSET: "When did the COVID-19 symptoms start?"      Last night 4. WORST SYMPTOM: "What is your worst symptom?" (e.g., cough, fever, shortness of breath, muscle aches)     lightheadedness 5. COUGH: "Do you have a cough?" If Yes, ask: "How bad is the cough?"       no 6. FEVER: "Do you have a fever?" If Yes, ask: "What is your temperature, how was it measured, and when did it start?"     no 7. RESPIRATORY STATUS: "Describe your breathing?" (e.g., shortness of breath, wheezing, unable to speak)      No complaints 8. BETTER-SAME-WORSE: "Are you getting better, staying the same or getting worse compared to yesterday?"  If getting worse, ask, "In what way?"     better 9. HIGH RISK DISEASE: "Do you have any chronic  medical problems?" (e.g., asthma, heart or lung disease, weak immune system, obesity, etc.)     no 10. VACCINE: "Have you had the COVID-19 vaccine?" If Yes, ask: "Which one, how many shots, when did you get it?"       none 11. BOOSTER: "Have you received your COVID-19 booster?" If Yes, ask: "Which one and when did you get it?"       none 13. OTHER SYMPTOMS: "Do you have any other symptoms?"  (e.g., chills, fatigue, headache, loss of smell or taste, muscle pain, sore throat)       Headache,Fatigue, nausea and vomiting 14. O2 SATURATION MONITOR:  "Do you use an oxygen saturation monitor (pulse oximeter) at home?" If Yes, ask "What is your reading (oxygen level) today?" "What is your usual oxygen saturation reading?" (e.g., 95%)       N/a  Protocols used: Coronavirus (COVID-19) Diagnosed or Suspected-A-AH

## 2021-12-14 DIAGNOSIS — E0500 Thyrotoxicosis with diffuse goiter without thyrotoxic crisis or storm: Secondary | ICD-10-CM | POA: Diagnosis not present

## 2021-12-30 ENCOUNTER — Other Ambulatory Visit: Payer: Self-pay

## 2021-12-30 ENCOUNTER — Emergency Department: Payer: Medicaid Other

## 2021-12-30 ENCOUNTER — Emergency Department
Admission: EM | Admit: 2021-12-30 | Discharge: 2021-12-30 | Disposition: A | Discharge status: Home / Self Care | Payer: Medicaid Other | Attending: Emergency Medicine | Admitting: Emergency Medicine

## 2021-12-30 DIAGNOSIS — Z20822 Contact with and (suspected) exposure to covid-19: Secondary | ICD-10-CM | POA: Insufficient documentation

## 2021-12-30 DIAGNOSIS — M791 Myalgia, unspecified site: Secondary | ICD-10-CM | POA: Diagnosis not present

## 2021-12-30 DIAGNOSIS — J101 Influenza due to other identified influenza virus with other respiratory manifestations: Secondary | ICD-10-CM | POA: Diagnosis not present

## 2021-12-30 DIAGNOSIS — R062 Wheezing: Secondary | ICD-10-CM | POA: Diagnosis not present

## 2021-12-30 DIAGNOSIS — F17210 Nicotine dependence, cigarettes, uncomplicated: Secondary | ICD-10-CM | POA: Insufficient documentation

## 2021-12-30 DIAGNOSIS — R059 Cough, unspecified: Secondary | ICD-10-CM | POA: Diagnosis not present

## 2021-12-30 LAB — RESP PANEL BY RT-PCR (FLU A&B, COVID) ARPGX2
Influenza A by PCR: POSITIVE — AB
Influenza B by PCR: NEGATIVE
SARS Coronavirus 2 by RT PCR: NEGATIVE

## 2021-12-30 MED ORDER — PSEUDOEPH-BROMPHEN-DM 30-2-10 MG/5ML PO SYRP: 5.0000 mL | ORAL_SOLUTION | Freq: Four times a day (QID) | ORAL | 0 refills | Status: AC | PRN

## 2021-12-30 NOTE — ED Provider Notes (Signed)
[facility] Emergency Department Provider Note   ____________________________________________   Event Date/Time   First MD Initiated Contact with Patient 12/30/21 1326     (approximate)  I have reviewed the triage vital signs and the nursing notes.   HISTORY  Chief Complaint Cough and Nasal Congestion   HPI Brianna Lewis is a 25 y.o. female presents to the ED with complaint of cough, sinus congestion, rhinorrhea and nasal congestion.  Patient states that since she subjectively had a fever and some sweating.  She is unaware of any actual fever.  Patient complains of some body aches that started approximately 6 days ago.         Past Medical History:  Diagnosis Date   Anemia    Anxiety    Heart murmur    Hyperthyroidism    Pneumonia     Patient Active Problem List   Diagnosis Date Noted   Atypical squamous cells of undetermined significance (ASCUS) on Papanicolaou smear of cervix 09/27/2020   Breakthrough bleeding on Nexplanon 09/14/2019   Adnexal pain 09/14/2019    History reviewed. No pertinent surgical history.  Prior to Admission medications   Medication Sig Start Date End Date Taking? Authorizing Provider  brompheniramine-pseudoephedrine-DM 30-2-10 MG/5ML syrup Take 5 mLs by mouth 4 (four) times daily as needed. 12/30/21  Yes Tommi Rumps, PA-C  methimazole (TAPAZOLE) 5 MG tablet Take by mouth. Patient not taking: Reported on 07/06/2021 04/22/19   [provider]  norgestimate-ethinyl estradiol (ORTHO-CYCLEN) 0.25-35 MG-MCG tablet Take 1 tablet by mouth daily. 07/06/21   Leftwich-Kirby, Wilmer Floor, CNM  terconazole (TERAZOL 3) 0.8 % vaginal cream Place 1 applicator vaginally at bedtime. 07/06/21   Leftwich-Kirby, Wilmer Floor, CNM  terconazole (TERAZOL 7) 0.4 % vaginal cream Place 1 applicator vaginally at bedtime. 07/11/21   Brock Bad, MD    Allergies Patient has no known allergies.  Family History  Problem Relation Age of  Onset   Diabetes Mother    Depression Mother    Hypertension Mother    Diabetes Maternal Grandmother    Arthritis Maternal Grandmother    Hypertension Maternal Grandmother    Stroke Maternal Grandmother     Social History Social History   Tobacco Use   Smoking status: Some Days    Packs/day: 0.50    Types: Cigarettes   Smokeless tobacco: Never  Substance Use Topics   Alcohol use: Yes    Comment: socially   Drug use: Yes    Types: Marijuana    Review of Systems Constitutional: Subjective fever/chills Eyes: No visual changes. ENT: No sore throat. Cardiovascular: Denies chest pain. Respiratory: Denies shortness of breath.  Positive for cough. Gastrointestinal: No abdominal pain.  No nausea, no vomiting.  No diarrhea.   Genitourinary: Negative for dysuria. Musculoskeletal: Positive for body aches. Skin: Negative for rash. Neurological: Negative for headaches, focal weakness or numbness. ____________________________________________   PHYSICAL EXAM:  VITAL SIGNS: ED Triage Vitals  Enc Vitals Group     BP 12/30/21 1226 124/61     Pulse Rate 12/30/21 1226 82     Resp 12/30/21 1226 16     Temp 12/30/21 1226 98.7 F (37.1 C)     Temp Source 12/30/21 1226 Oral     SpO2 12/30/21 1226 97 %     Weight 12/30/21 1226 144 lb (65.3 kg)     Height 12/30/21 1226 5\' 4"  (1.626 m)     Head Circumference --      Peak Flow --  Pain Score 12/30/21 1233 6     Pain Loc --      Pain Edu? --      Excl. in GC? --     Constitutional: Alert and oriented. Well appearing and in no acute distress. Eyes: Conjunctivae are normal.  Head: Atraumatic. Nose: Mild congestion/rhinnorhea. Neck: No stridor.   Cardiovascular: Normal rate, regular rhythm. Grossly normal heart sounds.  Good peripheral circulation. Respiratory: Normal respiratory effort.  No retractions. Lungs CTAB.  No wheezing noted on auscultation. Gastrointestinal: Soft and nontender. No distention.  Musculoskeletal: His  upper and lower extremities that any difficulty.  Normal gait was noted. Neurologic:  Normal speech and language. No gross focal neurologic deficits are appreciated.  Skin:  Skin is warm, dry and intact. No rash noted. Psychiatric: Mood and affect are normal. Speech and behavior are normal.  ____________________________________________   LABS (all labs ordered are listed, but only abnormal results are displayed)  Labs Reviewed  RESP PANEL BY RT-PCR (FLU A&B, COVID) ARPGX2 - Abnormal; Notable for the following components:      Result Value   Influenza A by PCR POSITIVE (*)    All other components within normal limits    RADIOLOGY I, Tommi Rumps, personally viewed and evaluated these images (plain radiographs) as part of my medical decision making, as well as reviewing the written report by the radiologist.   Official radiology report(s): DG Chest 2 View  Result Date: 12/30/2021 CLINICAL DATA:  Productive cough, sinus congestion, wheezing x 6 days and has progressively been getting worse. Pt sts boyfriend tested positive for flu x 4 days ago. No known heart or lung conditions. EXAM: CHEST - 2 VIEW COMPARISON:  None. FINDINGS: Normal heart, mediastinum and hila. Clear lungs.  No pleural effusion or pneumothorax. Skeletal structures are within normal limits. IMPRESSION: Normal chest radiographs. Electronically Signed   By: Amie Portland M.D.   On: 12/30/2021 13:30    ____________________________________________   PROCEDURES  Procedure(s) performed (including Critical Care):  Procedures   ____________________________________________   INITIAL IMPRESSION / ASSESSMENT AND PLAN / ED COURSE  As part of my medical decision making, I reviewed the following data within the electronic MEDICAL RECORD NUMBER Notes from prior ED visits and [place] Controlled Substance Database  25 year old female presents to the ED with cough, rhinorrhea cough possible fever and chills.  She also has had some  body aches.  Patient was made aware that her respiratory panel was positive for influenza A.  She was made aware that she is contagious.  Patient will continue with ibuprofen or Tylenol as needed i and increase fluids to stay hydrated. ____________________________________________   FINAL CLINICAL IMPRESSION(S) / ED DIAGNOSES  Final diagnoses:  Influenza A     ED Discharge Orders          Ordered    brompheniramine-pseudoephedrine-DM 30-2-10 MG/5ML syrup  4 times daily PRN        12/30/21 1344             Note:  This document was prepared using Dragon voice recognition software and may include unintentional dictation errors.    Tommi Rumps, PA-C 12/30/21 1359    Minna Antis, MD 12/30/21 [phone]

## 2021-12-30 NOTE — ED Triage Notes (Signed)
Pt states she first noticed her symptoms a couple days ago- pt states she had a runny and stuffy nose since before Christmas- pt states her bf tested positive for the flu 4 days ago

## 2021-12-30 NOTE — ED Notes (Signed)
Pt discharged prior to RN assessment.

## 2021-12-30 NOTE — Discharge Instructions (Signed)
Follow-up with your primary care provider if any continued problems.  Increase fluids to stay hydrated.  You may take Tylenol or ibuprofen as needed for headache, body aches or fever.  A prescription for Bromfed-DM was sent to the pharmacy as she needed for nasal congestion and cough.

## 2021-12-30 NOTE — ED Provider Notes (Signed)
°  Emergency Medicine Provider Triage Evaluation Note  Brianna Lewis , a 25 y.o.female,  was evaluated in triage.  Pt complains of cough, sinus congestion, wheezing.  Patient states that this been going on for the past 6 days and has progressively been getting worse.  Additionally endorses body aches.  Denies fever/chills, chest pain, abdominal pain, nausea/vomiting, or urinary symptoms.   Review of Systems  Positive: Cough, sinus congestion, wheezing Negative: Denies fever, chest pain, vomiting  Physical Exam   Vitals:   12/30/21 1226  BP: 124/61  Pulse: 82  Resp: 16  Temp: 98.7 F (37.1 C)  SpO2: 97%   Gen:   Awake, no distress   Resp:  Normal effort  MSK:   Moves extremities without difficulty  Other:    Medical Decision Making  Given the patient's initial medical screening exam, the following diagnostic evaluation has been ordered. The patient will be placed in the appropriate treatment space, once one is available, to complete the evaluation and treatment. I have discussed the plan of care with the patient and I have advised the patient that an ED physician or mid-level practitioner will reevaluate their condition after the test results have been received, as the results may give them additional insight into the type of treatment they may need.    Diagnostics: Respiratory panel, CXR.  Treatments: none immediately   Varney Daily, [place] 12/30/21 1238    Jene Every, MD 12/30/21 1309

## 2022-01-16 ENCOUNTER — Ambulatory Visit: Payer: Medicaid Other | Admitting: Advanced Practice Midwife

## 2022-01-16 NOTE — Progress Notes (Incomplete)
Does

## 2022-02-19 DIAGNOSIS — E0500 Thyrotoxicosis with diffuse goiter without thyrotoxic crisis or storm: Secondary | ICD-10-CM | POA: Diagnosis not present

## 2022-02-28 DIAGNOSIS — E0500 Thyrotoxicosis with diffuse goiter without thyrotoxic crisis or storm: Secondary | ICD-10-CM | POA: Diagnosis not present

## 2022-03-13 ENCOUNTER — Ambulatory Visit: Payer: Medicaid Other | Admitting: Advanced Practice Midwife

## 2022-04-16 ENCOUNTER — Other Ambulatory Visit: Payer: Self-pay | Admitting: Obstetrics

## 2022-04-16 DIAGNOSIS — N898 Other specified noninflammatory disorders of vagina: Secondary | ICD-10-CM

## 2022-04-18 DIAGNOSIS — E0500 Thyrotoxicosis with diffuse goiter without thyrotoxic crisis or storm: Secondary | ICD-10-CM | POA: Diagnosis not present

## 2022-04-18 DIAGNOSIS — R002 Palpitations: Secondary | ICD-10-CM | POA: Diagnosis not present

## 2022-04-18 MED ORDER — TERCONAZOLE 0.4 % VA CREA: 1.0000 | TOPICAL_CREAM | Freq: Every day | VAGINAL | 0 refills | Status: AC

## 2022-06-07 ENCOUNTER — Telehealth: Payer: Self-pay

## 2022-06-11 ENCOUNTER — Ambulatory Visit (INDEPENDENT_AMBULATORY_CARE_PROVIDER_SITE_OTHER): Payer: Medicaid Other | Admitting: Advanced Practice Midwife

## 2022-06-11 ENCOUNTER — Other Ambulatory Visit (HOSPITAL_COMMUNITY)
Admission: RE | Admit: 2022-06-11 | Discharge: 2022-06-11 | Disposition: A | Discharge status: Home / Self Care | Payer: Medicaid Other | Source: Ambulatory Visit | Attending: Advanced Practice Midwife | Admitting: Advanced Practice Midwife

## 2022-06-11 ENCOUNTER — Encounter: Payer: Self-pay | Admitting: Advanced Practice Midwife

## 2022-06-11 VITALS — BP 111/47 | HR 85 | Ht 64.0 in | Wt 141.0 lb

## 2022-06-11 DIAGNOSIS — N898 Other specified noninflammatory disorders of vagina: Secondary | ICD-10-CM | POA: Diagnosis not present

## 2022-06-11 DIAGNOSIS — B3731 Acute candidiasis of vulva and vagina: Secondary | ICD-10-CM

## 2022-06-11 DIAGNOSIS — Z01419 Encounter for gynecological examination (general) (routine) without abnormal findings: Secondary | ICD-10-CM

## 2022-06-11 DIAGNOSIS — Z113 Encounter for screening for infections with a predominantly sexual mode of transmission: Secondary | ICD-10-CM

## 2022-06-11 DIAGNOSIS — Z0000 Encounter for general adult medical examination without abnormal findings: Secondary | ICD-10-CM

## 2022-06-11 DIAGNOSIS — Z975 Presence of (intrauterine) contraceptive device: Secondary | ICD-10-CM | POA: Diagnosis not present

## 2022-06-11 DIAGNOSIS — N921 Excessive and frequent menstruation with irregular cycle: Secondary | ICD-10-CM

## 2022-06-11 DIAGNOSIS — R87610 Atypical squamous cells of undetermined significance on cytologic smear of cervix (ASC-US): Secondary | ICD-10-CM

## 2022-06-11 MED ORDER — NORGESTIMATE-ETH ESTRADIOL 0.25-35 MG-MCG PO TABS: 1.0000 | ORAL_TABLET | Freq: Every day | ORAL | 2 refills | Status: DC

## 2022-06-11 NOTE — Progress Notes (Signed)
Subjective:     Brianna Lewis is a 26 y.o. female here at [facility] for a routine exam.  Current complaints: breakthrough bleeding, with bleeding 2-3 weeks out of every month with Nexplanon. Nexplanon was placed 11/2019.  Personal health questionnaire reviewed: yes.  Do you have a primary care provider? yes Do you feel safe at home? yes  Flowsheet Row Office Visit from 06/11/2022 in [facility]  PHQ-2 Total Score 1       Health Maintenance Due  Topic Date Due   COVID-19 Vaccine (1) Never done   HPV VACCINES (1 - 2-dose series) Never done   CHLAMYDIA SCREENING  09/23/2021     Risk factors for chronic health problems: Smoking: Alchohol/how much: Pt BMI: Body mass index is 24.2 kg/m.   Gynecologic History Patient's last menstrual period was 04/19/2022 (approximate). Contraception: Nexplanon Last Pap: 2021. Results were: ASCUS with negative HPV, repeat Pap in 3 years per ASCCP guidelines Last mammogram: n/a.   Obstetric History OB History  Gravida Para Term Preterm AB Living  0 0 0 0 0 0  SAB IAB Ectopic Multiple Live Births  0 0 0 0 0     The following portions of the patient's history were reviewed and updated as appropriate: allergies, current medications, past family history, past medical history, past social history, past surgical history, and problem list.  Review of Systems Pertinent items noted in HPI and remainder of comprehensive ROS otherwise negative.    Objective:   BP (!) 111/47   Pulse 85   Ht 5\' 4"  (1.626 m)   Wt 141 lb (64 kg)   LMP 04/19/2022 (Approximate)   BMI 24.20 kg/m  VS reviewed, nursing note reviewed,  Constitutional: well developed, well nourished, no distress HEENT: normocephalic CV: normal rate Pulm/chest wall: normal effort Breast Exam:  Deferred with low risks and shared decision making, discussed recommendation to start mammogram between 40-50 yo/  Abdomen: soft Neuro: alert and oriented x  3 Skin: warm, dry Psych: affect normal Pelvic exam: Deferred--blind swab for GCC and testing for yeast BV with symptoms Bimanual exam: Deferred      Assessment/Plan:    1. Well woman exam with routine gynecological exam   2. Atypical squamous cells of undetermined significance (ASCUS) on Papanicolaou smear of cervix --Negative HPV--repeat Pap in 3 years, in 2024, per ASCCP guidelines   3. Breakthrough bleeding on Nexplanon --Pt bleeding 2-3 weeks out of every month, usually light but enough for pad/tampon --Discussed pt contraceptive plans and reviewed contraceptive methods based on pt preferences and effectiveness.  Pt prefers to keep Nexplanon until due to be replaced in November of this year.  Will make contraceptive appt and considering IUD in November. --Will try OCPs for breakthrough bleeding again. In the past, she reports she did not take all 28 days of pack. Encouraged to take 28 days, have light bleed, then either continue for 3 months consecutively, or stop and see if breakthrough bleeding is improved.  If bleeding lasts 10+ days again, can restart another 28 days of OCPs. - norgestimate-ethinyl estradiol (ORTHO-CYCLEN) 0.25-35 MG-MCG tablet; Take 1 tablet by mouth daily.  Dispense: 28 tablet; Refill: 2  4. Vaginal itching --Cervicovaginal test  5. Routine screening for STI (sexually transmitted infection) - Cervicovaginal ancillary only( [place]) - HIV Antibody (routine testing w rflx) - Hepatitis B Surface AntiGEN - Hepatitis C Antibody - RPR   Return in about 5 months (around 10/31/2022) for Gyn follow up  for contraception in November.   Sharen Counter, CNM 9:14 AM

## 2022-06-11 NOTE — Progress Notes (Signed)
Pt presents for AEX. Last Pap: 09/23/20 - ASCUS- next due 09/24/23 Birth Control Method: Nexplanon- due for removal 10/2022 Has prolonged periods, heavy and painful can last for about 4-6 weeks.  Desires STD Screen: Swab & Blood work Vaginal/Urinary Symptoms: Reports yeast symptoms today-white discharge with itching.

## 2022-06-11 NOTE — Patient Instructions (Signed)
For contraceptive information, go to www.bedsider.org.  Contraception Choices Contraception, also called birth control, refers to methods or devices that prevent pregnancy. Hormonal methods  Contraceptive implant A contraceptive implant is a thin, plastic tube that contains a hormone that prevents pregnancy. It is different from an intrauterine device (IUD). It is inserted into the upper part of the arm by a health care provider. Implants can be effective for up to 3 years. Progestin-only injections Progestin-only injections are injections of progestin, a synthetic form of the hormone progesterone. They are given every 3 months by a health care provider. Birth control pills Birth control pills are pills that contain hormones that prevent pregnancy. They must be taken once a day, preferably at the same time each day. A prescription is needed to use this method of contraception. Birth control patch The birth control patch contains hormones that prevent pregnancy. It is placed on the skin and must be changed once a week for three weeks and removed on the fourth week. A prescription is needed to use this method of contraception. Vaginal ring A vaginal ring contains hormones that prevent pregnancy. It is placed in the vagina for three weeks and removed on the fourth week. After that, the process is repeated with a new ring. A prescription is needed to use this method of contraception. Emergency contraceptive Emergency contraceptives prevent pregnancy after unprotected sex. They come in pill form and can be taken up to 5 days after sex. They work best the sooner they are taken after having sex. Most emergency contraceptives are available without a prescription. This method should not be used as your only form of birth control. Barrier methods  Female condom A female condom is a thin sheath that is worn over the penis during sex. Condoms keep sperm from going inside a woman's body. They can be used with a  sperm-killing substance (spermicide) to increase their effectiveness. They should be thrown away after one use. Female condom A female condom is a soft, loose-fitting sheath that is put into the vagina before sex. The condom keeps sperm from going inside a woman's body. They should be thrown away after one use. Diaphragm A diaphragm is a soft, dome-shaped barrier. It is inserted into the vagina before sex, along with a spermicide. The diaphragm blocks sperm from entering the uterus, and the spermicide kills sperm. A diaphragm should be left in the vagina for 6-8 hours after sex and removed within 24 hours. A diaphragm is prescribed and fitted by a health care provider. A diaphragm should be replaced every 1-2 years, after giving birth, after gaining more than 15 lb (6.8 kg), and after pelvic surgery. Cervical cap A cervical cap is a round, soft latex or plastic cup that fits over the cervix. It is inserted into the vagina before sex, along with spermicide. It blocks sperm from entering the uterus. The cap should be left in place for 6-8 hours after sex and removed within 48 hours. A cervical cap must be prescribed and fitted by a health care provider. It should be replaced every 2 years. Sponge A sponge is a soft, circular piece of polyurethane foam with spermicide in it. The sponge helps block sperm from entering the uterus, and the spermicide kills sperm. To use it, you make it wet and then insert it into the vagina. It should be inserted before sex, left in for at least 6 hours after sex, and removed and thrown away within 30 hours. Spermicides Spermicides are chemicals that kill  or block sperm from entering the cervix and uterus. They can come as a cream, jelly, suppository, foam, or tablet. A spermicide should be inserted into the vagina with an applicator at least 10-15 minutes before sex to allow time for it to work. The process must be repeated every time you have sex. Spermicides do not require a  prescription. Intrauterine contraception Intrauterine device (IUD) An IUD is a T-shaped device that is put in a woman's uterus. There are two types: Hormone IUD.This type contains progestin, a synthetic form of the hormone progesterone. This type can stay in place for 3-5 years. Copper IUD.This type is wrapped in copper wire. It can stay in place for 10 years. Permanent methods of contraception Female tubal ligation In this method, a woman's fallopian tubes are sealed, tied, or blocked during surgery to prevent eggs from traveling to the uterus. Hysteroscopic sterilization In this method, a small, flexible insert is placed into each fallopian tube. The inserts cause scar tissue to form in the fallopian tubes and block them, so sperm cannot reach an egg. The procedure takes about 3 months to be effective. Another form of birth control must be used during those 3 months. Female sterilization This is a procedure to tie off the tubes that carry sperm (vasectomy). After the procedure, the man can still ejaculate fluid (semen). Another form of birth control must be used for 3 months after the procedure. Natural planning methods Natural family planning In this method, a couple does not have sex on days when the woman could become pregnant. Calendar method In this method, the woman keeps track of the length of each menstrual cycle, identifies the days when pregnancy can happen, and does not have sex on those days. Ovulation method In this method, a couple avoids sex during ovulation. Symptothermal method This method involves not having sex during ovulation. The woman typically checks for ovulation by watching changes in her temperature and in the consistency of cervical mucus. Post-ovulation method In this method, a couple waits to have sex until after ovulation. Where to find more information Centers for Disease Control and Prevention: [employer]Ex[employer].com.br Summary Contraception, also called birth control,  refers to methods or devices that prevent pregnancy. Hormonal methods of contraception include implants, injections, pills, patches, vaginal rings, and emergency contraceptives. Barrier methods of contraception can include female condoms, female condoms, diaphragms, cervical caps, sponges, and spermicides. There are two types of IUDs (intrauterine devices). An IUD can be put in a woman's uterus to prevent pregnancy for 3-5 years. Permanent sterilization can be done through a procedure for males and females. Natural family planning methods involve nothaving sex on days when the woman could become pregnant. This information is not intended to replace advice given to you by your health care provider. Make sure you discuss any questions you have with your health care provider. Document Revised: 05/23/2020 Document Reviewed: 05/23/2020 Elsevier Patient Education  2023 [employer].

## 2022-06-12 LAB — CERVICOVAGINAL ANCILLARY ONLY
Bacterial Vaginitis (gardnerella): NEGATIVE
Candida Glabrata: NEGATIVE
Candida Vaginitis: POSITIVE — AB
Chlamydia: NEGATIVE
Comment: NEGATIVE
Comment: NEGATIVE
Comment: NEGATIVE
Comment: NEGATIVE
Comment: NEGATIVE
Comment: NORMAL
Neisseria Gonorrhea: NEGATIVE
Trichomonas: NEGATIVE

## 2022-06-12 LAB — HEPATITIS B SURFACE ANTIGEN: Hepatitis B Surface Ag: NEGATIVE

## 2022-06-12 LAB — RPR: RPR Ser Ql: NONREACTIVE

## 2022-06-12 LAB — HEPATITIS C ANTIBODY: Hep C Virus Ab: NONREACTIVE

## 2022-06-12 LAB — HIV ANTIBODY (ROUTINE TESTING W REFLEX): HIV Screen 4th Generation wRfx: NONREACTIVE

## 2022-06-12 MED ORDER — FLUCONAZOLE 150 MG PO TABS: 150.0000 mg | ORAL_TABLET | ORAL | 1 refills | Status: AC | PRN

## 2022-06-12 NOTE — Addendum Note (Signed)
Addended by: Sharen Counter A on: 06/12/2022 08:31 PM   Modules accepted: Orders

## 2022-09-03 ENCOUNTER — Encounter: Payer: Self-pay | Admitting: Advanced Practice Midwife

## 2022-09-05 ENCOUNTER — Other Ambulatory Visit (HOSPITAL_BASED_OUTPATIENT_CLINIC_OR_DEPARTMENT_OTHER): Payer: Self-pay | Admitting: Advanced Practice Midwife

## 2022-09-05 DIAGNOSIS — Z975 Presence of (intrauterine) contraceptive device: Secondary | ICD-10-CM

## 2022-09-05 MED ORDER — NORGESTIMATE-ETH ESTRADIOL 0.25-35 MG-MCG PO TABS: 1.0000 | ORAL_TABLET | Freq: Every day | ORAL | 2 refills | Status: DC

## 2022-09-22 ENCOUNTER — Encounter: Payer: Self-pay | Admitting: Advanced Practice Midwife

## 2022-10-03 ENCOUNTER — Other Ambulatory Visit (HOSPITAL_BASED_OUTPATIENT_CLINIC_OR_DEPARTMENT_OTHER): Payer: Self-pay | Admitting: Advanced Practice Midwife

## 2022-10-03 DIAGNOSIS — Z975 Presence of (intrauterine) contraceptive device: Secondary | ICD-10-CM

## 2022-10-03 MED ORDER — NORETHINDRONE-ETH ESTRADIOL 1-35 MG-MCG PO TABS: 1.0000 | ORAL_TABLET | Freq: Every day | ORAL | 1 refills | Status: DC

## 2022-11-06 NOTE — Telephone Encounter (Signed)
CALLED TO CONFIRM APPT  Outgoing call

## 2022-11-12 ENCOUNTER — Encounter: Payer: Self-pay | Admitting: Advanced Practice Midwife

## 2022-11-12 ENCOUNTER — Ambulatory Visit (INDEPENDENT_AMBULATORY_CARE_PROVIDER_SITE_OTHER): Payer: Medicaid Other | Admitting: Advanced Practice Midwife

## 2022-11-12 VITALS — BP 109/62 | HR 63 | Ht 64.0 in | Wt 139.4 lb

## 2022-11-12 DIAGNOSIS — Z3046 Encounter for surveillance of implantable subdermal contraceptive: Secondary | ICD-10-CM

## 2022-11-12 DIAGNOSIS — Z30017 Encounter for initial prescription of implantable subdermal contraceptive: Secondary | ICD-10-CM

## 2022-11-12 DIAGNOSIS — Z01812 Encounter for preprocedural laboratory examination: Secondary | ICD-10-CM

## 2022-11-12 DIAGNOSIS — R87610 Atypical squamous cells of undetermined significance on cytologic smear of cervix (ASC-US): Secondary | ICD-10-CM

## 2022-11-12 LAB — POCT URINE PREGNANCY: Preg Test, Ur: NEGATIVE

## 2022-11-12 MED ORDER — ETONOGESTREL 68 MG ~~LOC~~ IMPL
68.0000 mg | DRUG_IMPLANT | Freq: Once | SUBCUTANEOUS | Status: AC
  Administered 2022-11-12: 68 mg via SUBCUTANEOUS

## 2022-11-12 NOTE — Progress Notes (Signed)
Pt presents for Nexplanon removal and reinsertion. Pt reports having some spotting, but believed the oral BC she was given has helped with the breakthrough bleeding. Pt doesn't have any other concerns.

## 2022-11-12 NOTE — Progress Notes (Signed)
GYNECOLOGY CLINIC PROCEDURE NOTE  Brianna Lewis is a 26 y.o. G0P0000 here for Nexplanon removal and Nexplanon insertion.  She has frequent spotting with current Nexplanon but is concerned about other methods and wants to stay with Nexplanon. Questions answered about other methods, including pills/patches/rings, Depo, and IUDs today.   No other gynecologic concerns.   Nexplanon Removal and Insertion  Patient identified, informed consent performed, consent signed.   Patient does understand that irregular bleeding is a very common side effect of this medication. She was advised to have backup contraception for one week after replacement of the implant. Pregnancy test in clinic today was negative.  Appropriate time out taken. Implanon site identified. Area prepped in usual sterile fashon. One ml of 1% lidocaine was used to anesthetize the area at the distal end of the implant. A small stab incision was made right beside the implant on the distal portion. The Nexplanon rod was grasped using hemostats and removed without difficulty. There was minimal blood loss. There were no complications. Area was then injected with 3 ml of 1 % lidocaine. She was re-prepped with betadine, Nexplanon removed from packaging, Device confirmed in needle, then inserted full length of needle and withdrawn per handbook instructions. Nexplanon was able to palpated in the patient's arm; patient palpated the insert herself.  There was minimal blood loss. Patient insertion site covered with guaze and a pressure bandage to reduce any bruising. The patient tolerated the procedure well and was given post procedure instructions.  She was advised to have backup contraception for one week.    Return if symptoms worsen or fail to improve.   Sharen Counter, CNM 5:58 PM

## 2022-11-18 ENCOUNTER — Encounter: Payer: Self-pay | Admitting: Advanced Practice Midwife

## 2022-12-06 ENCOUNTER — Ambulatory Visit: Payer: Medicaid Other | Admitting: Student

## 2022-12-11 ENCOUNTER — Telehealth: Payer: Self-pay | Admitting: Advanced Practice Midwife

## 2022-12-11 NOTE — Telephone Encounter (Signed)
NA

## 2022-12-16 ENCOUNTER — Other Ambulatory Visit (HOSPITAL_BASED_OUTPATIENT_CLINIC_OR_DEPARTMENT_OTHER): Payer: Self-pay | Admitting: Advanced Practice Midwife

## 2022-12-16 DIAGNOSIS — Z975 Presence of (intrauterine) contraceptive device: Secondary | ICD-10-CM

## 2022-12-16 MED ORDER — NORETHINDRONE-ETH ESTRADIOL 1-35 MG-MCG PO TABS: 1.0000 | ORAL_TABLET | Freq: Every day | ORAL | 2 refills | Status: DC

## 2022-12-27 ENCOUNTER — Other Ambulatory Visit: Payer: Self-pay | Admitting: Emergency Medicine

## 2022-12-27 DIAGNOSIS — N921 Excessive and frequent menstruation with irregular cycle: Secondary | ICD-10-CM

## 2022-12-27 MED ORDER — NORETHINDRONE-ETH ESTRADIOL 1-35 MG-MCG PO TABS: 1.0000 | ORAL_TABLET | Freq: Every day | ORAL | 2 refills | Status: AC

## 2022-12-27 NOTE — Progress Notes (Signed)
Rx for OCP resent. Pt did not resend to pharmacy.

## 2023-01-11 ENCOUNTER — Encounter: Payer: Self-pay | Admitting: Advanced Practice Midwife

## 2023-03-04 ENCOUNTER — Ambulatory Visit: Payer: Medicaid Other | Admitting: Advanced Practice Midwife

## 2023-03-08 IMAGING — CR DG CHEST 2V
1 series · 2 of 2 positions shown · non-contrast
Comparison: None.

CLINICAL DATA: Productive cough, sinus congestion, wheezing x 6
days and has progressively been getting worse. Pt Sugey boyfriend
tested positive for flu x 4 days ago. No known heart or lung
conditions.

EXAM:
CHEST - 2 VIEW

[Series 1: dg chest 2 view · 0.14mm/px · 2 of 2 slices shown]
[im 1/2]
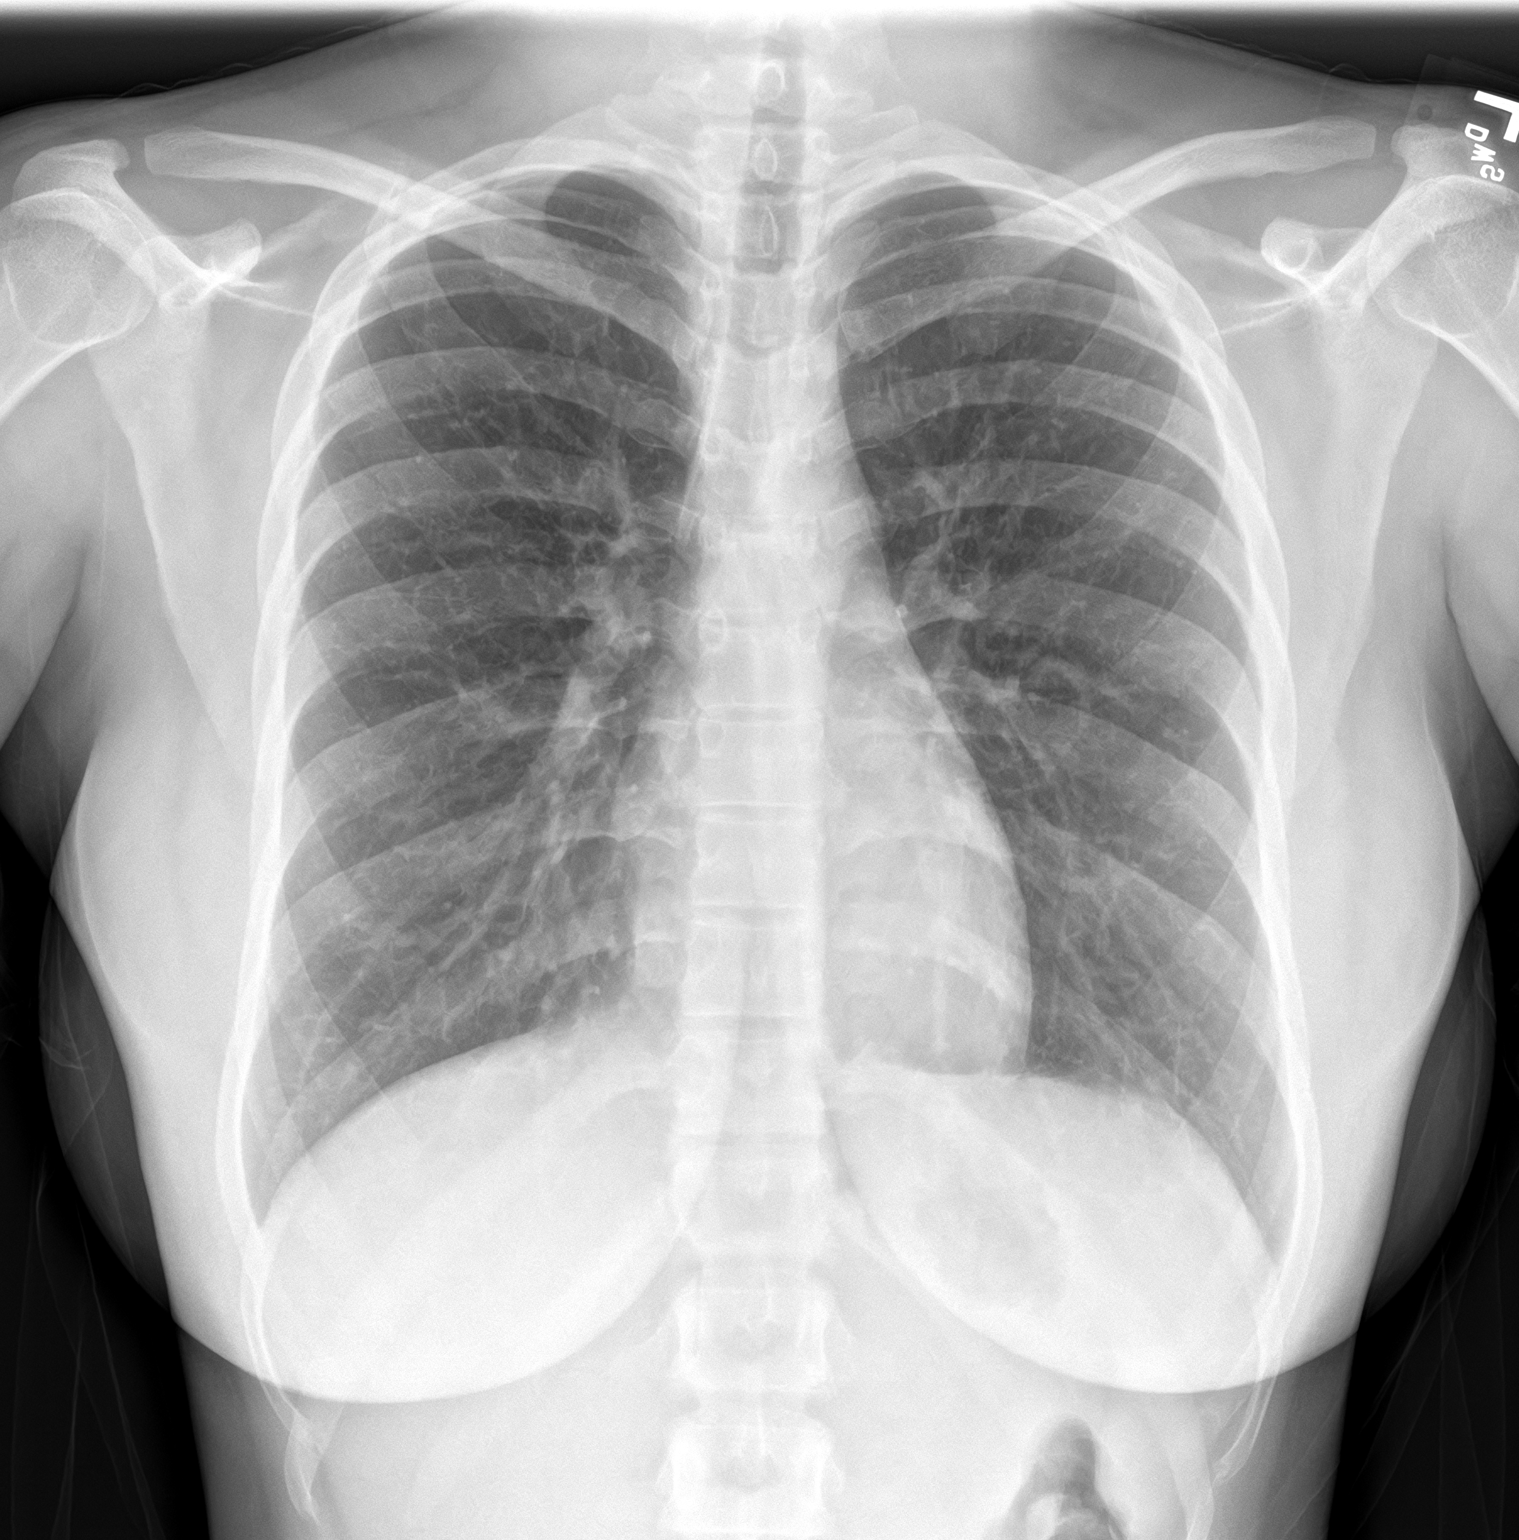
[im 2/2]
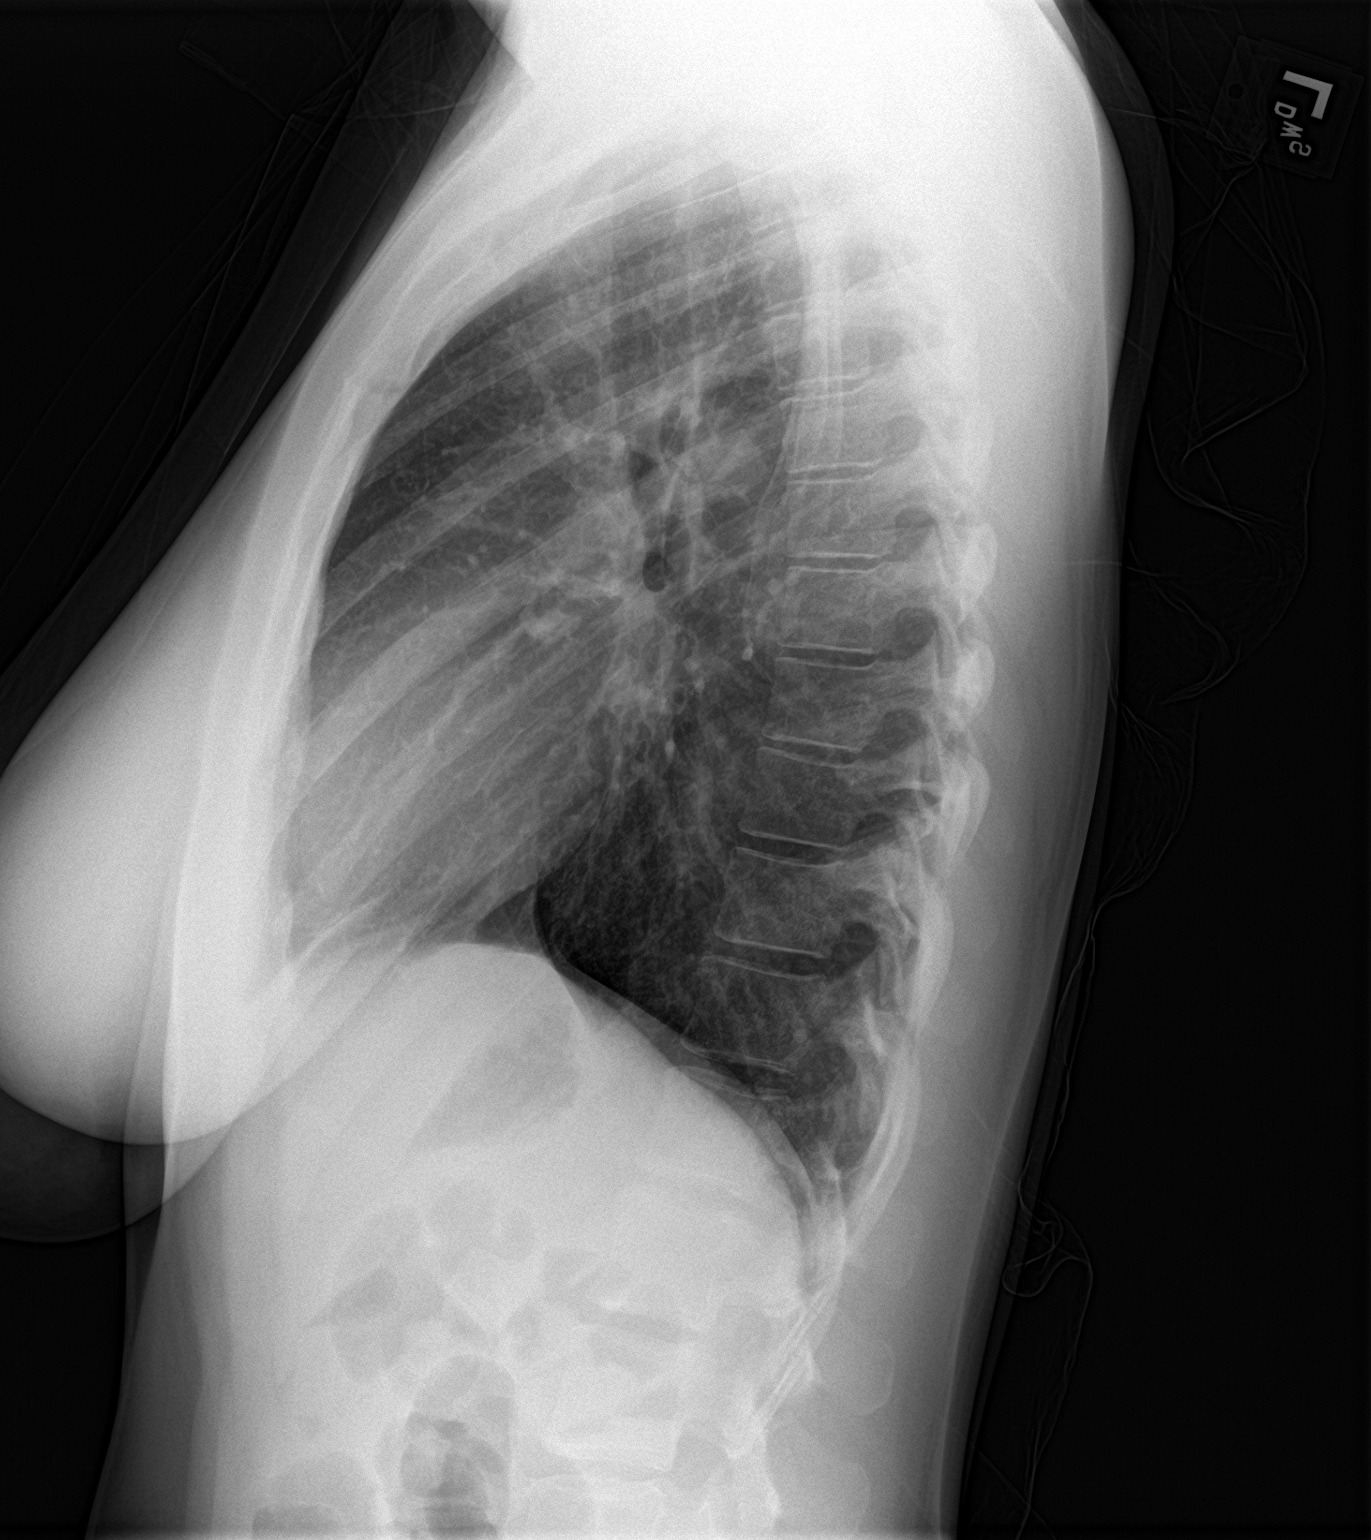

[2 of 2 positions shown; findings below may reference images not displayed]

FINDINGS: Normal heart, mediastinum and hila.

Clear lungs.  No pleural effusion or pneumothorax.

Skeletal structures are within normal limits.
IMPRESSION: Normal chest radiographs.

## 2023-04-27 ENCOUNTER — Encounter: Payer: Self-pay | Admitting: Advanced Practice Midwife

## 2023-04-30 ENCOUNTER — Other Ambulatory Visit: Payer: Self-pay

## 2023-04-30 DIAGNOSIS — N760 Acute vaginitis: Secondary | ICD-10-CM

## 2023-04-30 MED ORDER — METRONIDAZOLE 500 MG PO TABS: 500.0000 mg | ORAL_TABLET | Freq: Two times a day (BID) | ORAL | 0 refills | Status: AC

## 2023-05-29 ENCOUNTER — Ambulatory Visit: Payer: Medicaid Other | Admitting: Advanced Practice Midwife

## 2023-07-30 ENCOUNTER — Ambulatory Visit: Payer: Medicaid Other | Admitting: Advanced Practice Midwife

## 2023-11-11 ENCOUNTER — Ambulatory Visit: Payer: Medicaid Other | Admitting: Advanced Practice Midwife

## 2023-11-11 NOTE — Progress Notes (Deleted)
   Subjective:     Brianna Lewis is a 27 y.o. female here at [facility] *** for a routine exam.  Current complaints: ***.  Personal and family health history reviewed: {yes/no:9010}.  Do you have a primary care provider? *** Do you feel safe at home? ***  Flowsheet Row Office Visit from 06/11/2022 in [facility] at [facility] Total Score 1       Health Maintenance Due  Topic Date Due   HPV VACCINES (1 - 3-dose series) Never done   DTaP/Tdap/Td (1 - Tdap) Never done   CHLAMYDIA SCREENING  06/12/2023   INFLUENZA VACCINE  08/01/2023   COVID-19 Vaccine (1 - 2023-24 season) Never done   Cervical Cancer Screening (Pap smear)  09/24/2023     Risk factors for chronic health problems: Smoking: Alchohol/how much: Pt BMI: There is no height or weight on file to calculate BMI.   Gynecologic History No LMP recorded. Patient has had an implant. Contraception: {method:5051} Last Pap: ***. Results were: {norm/abn:16337} Last mammogram: ***. Results were: {norm/abn:16337}  Obstetric History OB History  Gravida Para Term Preterm AB Living  0 0 0 0 0 0  SAB IAB Ectopic Multiple Live Births  0 0 0 0 0     {Common ambulatory SmartLinks:19316}  Review of Systems {ros; complete:30496}    Objective:  There were no vitals taken for this visit.  VS reviewed, nursing note reviewed,  Constitutional: well developed, well nourished, no distress HEENT: normocephalic, thyroid without enlargement or mass HEART: RRR, no murmurs rubs/gallops RESP: clear and equal to auscultation bilaterally in all lobes  Breast Exam:  ***Deferred with low risks and shared decision making, discussed recommendation to start mammogram between 40-50 yo/ exam performed: right breast normal without mass, skin or nipple changes or axillary nodes, left breast normal without mass, skin or nipple changes or axillary nodes Abdomen: soft Neuro: alert and oriented x 3 Skin: warm, dry Psych:  affect normal Pelvic exam: ***Deferred/ Performed: Cervix pink, visually closed, without lesion, scant white creamy discharge, vaginal walls and external genitalia normal Bimanual exam: Cervix 0/long/high, firm, anterior, neg CMT, uterus nontender, nonenlarged, adnexa without tenderness, enlargement, or mass        Assessment/Plan:   There are no diagnoses linked to this encounter.     No follow-ups on file.   Sharen Counter, CNM 8:34 AM

## 2024-01-28 ENCOUNTER — Ambulatory Visit: Payer: Medicaid Other | Admitting: Advanced Practice Midwife

## 2024-02-04 ENCOUNTER — Ambulatory Visit: Payer: Medicaid Other | Admitting: Advanced Practice Midwife

## 2024-02-05 ENCOUNTER — Other Ambulatory Visit: Payer: Self-pay

## 2024-02-05 ENCOUNTER — Emergency Department
Admission: EM | Admit: 2024-02-05 | Discharge: 2024-02-05 | Disposition: A | Discharge status: Home / Self Care | Payer: Medicaid Other | Attending: Emergency Medicine | Admitting: Emergency Medicine

## 2024-02-05 DIAGNOSIS — J210 Acute bronchiolitis due to respiratory syncytial virus: Secondary | ICD-10-CM | POA: Insufficient documentation

## 2024-02-05 DIAGNOSIS — Z20822 Contact with and (suspected) exposure to covid-19: Secondary | ICD-10-CM | POA: Diagnosis not present

## 2024-02-05 DIAGNOSIS — R059 Cough, unspecified: Secondary | ICD-10-CM | POA: Diagnosis present

## 2024-02-05 LAB — RESP PANEL BY RT-PCR (RSV, FLU A&B, COVID)  RVPGX2
Influenza A by PCR: NEGATIVE
Influenza B by PCR: NEGATIVE
Resp Syncytial Virus by PCR: POSITIVE — AB
SARS Coronavirus 2 by RT PCR: NEGATIVE

## 2024-02-05 MED ORDER — IPRATROPIUM-ALBUTEROL 0.5-2.5 (3) MG/3ML IN SOLN
3.0000 mL | Freq: Once | RESPIRATORY_TRACT | Status: AC
  Administered 2024-02-05: 3 mL via RESPIRATORY_TRACT
  Filled 2024-02-05: qty 3

## 2024-02-05 MED ORDER — PREDNISONE 20 MG PO TABS
60.0000 mg | ORAL_TABLET | Freq: Once | ORAL | Status: AC
  Administered 2024-02-05: 60 mg via ORAL
  Filled 2024-02-05: qty 3

## 2024-02-05 MED ORDER — ALBUTEROL SULFATE HFA 108 (90 BASE) MCG/ACT IN AERS: 2.0000 | INHALATION_SPRAY | RESPIRATORY_TRACT | 1 refills | Status: AC | PRN

## 2024-02-05 MED ORDER — PREDNISONE 10 MG PO TABS: 50.0000 mg | ORAL_TABLET | Freq: Every day | ORAL | 0 refills | Status: AC

## 2024-02-05 NOTE — ED Provider Triage Note (Signed)
 Emergency Medicine Provider Triage Evaluation Note  Evelisse Szalkowski , a 28 y.o. female  was evaluated in triage.  Pt complains of cough, headache, body aches, states sick contacts for flu.  Review of Systems  Positive:  Negative:   Physical Exam  BP 126/83 (BP Location: Left Arm)   Pulse (!) 120   Temp 99.7 F (37.6 C) (Oral)   Resp 19   Wt 63.2 kg   SpO2 98%   BMI 23.92 kg/m  Gen:   Awake, no distress   Resp:  Normal effort  MSK:   Moves extremities without difficulty  Other:    Medical Decision Making  Medically screening exam initiated at 4:44 PM.  Appropriate orders placed.  Irvin Falcon was informed that the remainder of the evaluation will be completed by another provider, this initial triage assessment does not replace that evaluation, and the importance of remaining in the ED until their evaluation is complete.  Patient with URI, Respiratory panel ordered   Janit Kast, PA-C 02/05/24 1646

## 2024-02-05 NOTE — ED Provider Notes (Signed)
 [facility] Provider Note    None    (approximate)   History   No chief complaint on file.   HPI  Brianna Lewis is a 28 y.o. female with history of anemia, anxiety and as listed in EMR presents to the emergency department for treatment and evaluation of productive cough, fever, body aches, and wheezing for the past 4 days.  She has been exposed to influenza A.  She denies fever.  No relief with over-the-counter medications.      Physical Exam   Triage Vital Signs: ED Triage Vitals  Encounter Vitals Group     BP 02/05/24 1629 126/83     Systolic BP Percentile --      Diastolic BP Percentile --      Pulse Rate 02/05/24 1629 (!) 120     Resp 02/05/24 1629 19     Temp 02/05/24 1629 99.7 F (37.6 C)     Temp Source 02/05/24 1629 Oral     SpO2 02/05/24 1629 98 %     Weight 02/05/24 1631 139 lb 5.3 oz (63.2 kg)     Height --      Head Circumference --      Peak Flow --      Pain Score 02/05/24 1631 0     Pain Loc --      Pain Education --      Exclude from Growth Chart --     Most recent vital signs: Vitals:   02/05/24 1629 02/05/24 1836  BP: 126/83 127/79  Pulse: (!) 120 (!) 102  Resp: 19 20  Temp: 99.7 F (37.6 C)   SpO2: 98% 97%    General: Awake, no distress.  CV:  Good peripheral perfusion.  Resp:  Normal effort.  Faint expiratory wheeze noted in the left lower lobe Abd:  No distention.  Other:     ED Results / Procedures / Treatments   Labs (all labs ordered are listed, but only abnormal results are displayed) Labs Reviewed  RESP PANEL BY RT-PCR (RSV, FLU A&B, COVID)  RVPGX2 - Abnormal; Notable for the following components:      Result Value   Resp Syncytial Virus by PCR POSITIVE (*)    All other components within normal limits     EKG  Not indicated   RADIOLOGY  Image and radiology report reviewed and interpreted by me. Radiology report consistent with the same.  Not indicated  PROCEDURES:  Critical Care  performed: No  Procedures   MEDICATIONS ORDERED IN ED:  Medications  ipratropium-albuterol  (DUONEB) 0.5-2.5 (3) MG/3ML nebulizer solution 3 mL (3 mLs Nebulization Given 02/05/24 1842)  predniSONE  (DELTASONE ) tablet 60 mg (60 mg Oral Given 02/05/24 1841)     IMPRESSION / MDM / ASSESSMENT AND PLAN / ED COURSE   I have reviewed the triage note.  Differential diagnosis includes, but is not limited to, COVID, influenza A, influenza B, RSV  Patient's presentation is most consistent with acute complicated illness / injury requiring diagnostic workup.  28 year old female presenting to the emergency department for treatment and evaluation of symptoms as described in the HPI.  While awaiting ER room assignment, respiratory panel was collected.  She is RSV positive.  On exam, she does have some wheezing in the left lower and will be given an albuterol  treatment here and first dose of prednisone .  Prescriptions for the same submitted to the patient's pharmacy and she was given follow-up and ER return precautions.  FINAL CLINICAL IMPRESSION(S) / ED DIAGNOSES   Final diagnoses:  RSV (acute bronchiolitis due to respiratory syncytial virus)     Rx / DC Orders   ED Discharge Orders          Ordered    predniSONE  (DELTASONE ) 10 MG tablet  Daily        02/05/24 1836    albuterol  (VENTOLIN  HFA) 108 (90 Base) MCG/ACT inhaler  Every 4 hours PRN        02/05/24 1836             Note:  This document was prepared using Dragon voice recognition software and may include unintentional dictation errors.   Herlinda Kirk NOVAK, FNP 02/05/24 1929    Malvina Alm DASEN, MD 02/05/24 [phone]

## 2024-02-05 NOTE — ED Triage Notes (Signed)
 C/O productive cough, fever, body aches, wheezing since Saturday.  AAOx3.  Skin warm and dry. NAD

## 2024-02-05 NOTE — Discharge Instructions (Signed)
 Follow up with primary care or return to the ER for symptoms that are not improving over the week or if symptoms worsen.

## 2024-02-05 NOTE — ED Notes (Signed)
 ..  The patient is A&OX4, ambulatory at d/c with independent steady gait, NAD. Pt verbalized understanding of d/c instructions and follow up care.

## 2024-02-05 NOTE — ED Notes (Signed)
Pt is getting her breathing treatment and then will be good to go home. She verbalized understanding of d/c instructions, prescriptions and follow up care.

## 2024-03-10 ENCOUNTER — Ambulatory Visit: Admitting: Family Medicine

## 2024-03-10 ENCOUNTER — Encounter: Payer: Self-pay | Admitting: Family Medicine

## 2024-03-10 ENCOUNTER — Ambulatory Visit

## 2024-03-10 DIAGNOSIS — Z113 Encounter for screening for infections with a predominantly sexual mode of transmission: Secondary | ICD-10-CM

## 2024-03-10 LAB — WET PREP FOR TRICH, YEAST, CLUE
Trichomonas Exam: NEGATIVE
Yeast Exam: NEGATIVE

## 2024-03-10 LAB — HM HIV SCREENING LAB: HM HIV Screening: NEGATIVE

## 2024-03-10 NOTE — Progress Notes (Signed)
 Pt is here for STD testing.  Wet mount results reviewed, no treatment required per SO.  Condoms given.  Berdie Ogren, RN

## 2024-03-10 NOTE — Progress Notes (Signed)
 [facility] Department STI clinic [address] [place] [address] Main phone: [phone]  STI screening visit  Subjective:  Keshana Tietje is a 28 y.o. female being seen today for an STI screening visit. The patient reports they do not have symptoms.  Patient reports that they do not desire a pregnancy in the next year.   They reported they are not interested in discussing contraception today.    Patient's last menstrual period was 03/09/2024 (exact date).  Patient has the following medical conditions:  Patient Active Problem List   Diagnosis Date Noted   Atypical squamous cells of undetermined significance (ASCUS) on Papanicolaou smear of cervix 09/27/2020   Breakthrough bleeding on Nexplanon 09/14/2019   Adnexal pain 09/14/2019    Chief Complaint  Patient presents with   SEXUALLY TRANSMITTED DISEASE    STD testing    HPI HPI Patient reports to clinic with c/o discharge that is white and "just there". States her last sex was about 5 weeks ago, and she is currently on her period. Has the Nexplanon in place  Does the patient using douching products? No  Last HIV test per patient/review of record was No results found for: "HMHIVSCREEN"  Lab Results  Component Value Date   HIV Non Reactive 06/11/2022     Last HEPC test per patient/review of record was No results found for: "HMHEPCSCREEN" No components found for: "HEPC"   Last HEPB test per patient/review of record was No components found for: "HMHEPBSCREEN"  Patient reports last pap was:      Component Value Date/Time   DIAGPAP (A) 09/23/2020 1113    - Atypical squamous cells of undetermined significance (ASC-US)   HPVHIGH Negative 09/23/2020 1113   ADEQPAP  09/23/2020 1113    Satisfactory for evaluation; transformation zone component PRESENT.   No results found for: "SPECADGYN" Result Date Procedure Results Follow-ups  09/23/2020 Cytology - PAP( [place]) High risk HPV:  Negative Adequacy: Satisfactory for evaluation; transformation zone component PRESENT. Diagnosis: - Atypical squamous cells of undetermined significance (ASC-US) (A) Microorganisms: Fungal organisms present consistent with Candida spp. Comment: Normal Reference Range HPV - Negative     Screening for MPX risk: Does the patient have an unexplained rash? No Is the patient MSM? No Does the patient endorse multiple sex partners or anonymous sex partners? No Did the patient have close or sexual contact with a person diagnosed with MPX? No Has the patient traveled outside the [place] where MPX is endemic? No Is there a high clinical suspicion for MPX-- evidenced by one of the following No  -Unlikely to be chickenpox  -Lymphadenopathy  -Rash that present in same phase of evolution on any given body part See flowsheet for further details and programmatic requirements.   Immunization history:   There is no immunization history on file for this patient.   The following portions of the patient's history were reviewed and updated as appropriate: allergies, current medications, past medical history, past social history, past surgical history and problem list.  Objective:  There were no vitals filed for this visit.  Physical Exam Vitals and nursing note reviewed.  Constitutional:      Appearance: Normal appearance.  HENT:     Head: Normocephalic.     Mouth/Throat:     Mouth: Mucous membranes are moist.  Cardiovascular:     Rate and Rhythm: Normal rate.  Pulmonary:     Effort: Pulmonary effort is normal.  Abdominal:     Palpations: Abdomen  is soft.  Genitourinary:    Comments: Declined genital exam- no symptoms, self swabbed Musculoskeletal:        General: Normal range of motion.  Lymphadenopathy:     Head:     Right side of head: No submandibular, preauricular or posterior auricular adenopathy.     Left side of head: No submandibular, preauricular or posterior auricular adenopathy.      Cervical: No cervical adenopathy.     Upper Body:     Right upper body: No supraclavicular or axillary adenopathy.     Left upper body: No supraclavicular or axillary adenopathy.  Skin:    General: Skin is warm and dry.  Neurological:     Mental Status: She is alert and oriented to person, place, and time.  Psychiatric:        Mood and Affect: Mood normal.     Assessment and Plan:  Maleyah Evans is a 28 y.o. female presenting to the [facility] Department for STI screening  1. Screening for venereal disease (Primary)  - Chlamydia/Gonorrhea [place] [facility] - HIV [place] [facility] - Syphilis Serology, [place] [facility] - WET PREP FOR TRICH, YEAST, CLUE   Patient accepted all screenings including  vaginal CT/GC and bloodwork for HIV/RPR, and wet prep. Patient meets criteria for HepB screening? No. Ordered? not applicable Patient meets criteria for HepC screening? No. Ordered? not applicable  Treat wet prep per standing order Discussed time line for [facility] results and that patient will be called with positive results and encouraged patient to call if she had not heard in 2 weeks.  Counseled to return or seek care for continued or worsening symptoms Recommended repeat testing in 3 months with positive results. Recommended condom use with all sex for STI prevention.   Patient is currently using *Nexplanon to prevent pregnancy.    Return if symptoms worsen or fail to improve, for STI screening.  Future Appointments  Date Time Provider Department Center  03/16/2024  2:10 PM Leftwich-Kirby, Wilmer Floor, CNM [facility] None    Lenice Llamas, [place]

## 2024-03-16 ENCOUNTER — Ambulatory Visit: Payer: Medicaid Other | Admitting: Advanced Practice Midwife

## 2024-04-13 ENCOUNTER — Ambulatory Visit: Admitting: Advanced Practice Midwife

## 2024-05-05 DIAGNOSIS — E0500 Thyrotoxicosis with diffuse goiter without thyrotoxic crisis or storm: Secondary | ICD-10-CM | POA: Diagnosis not present

## 2024-05-06 ENCOUNTER — Ambulatory Visit: Admitting: Advanced Practice Midwife

## 2024-05-06 NOTE — Progress Notes (Deleted)
   Subjective:     Brianna Lewis is a 28 y.o. female here at [facility] *** for a routine exam.  Current complaints: ***.  Personal and family health history reviewed: {yes/no:9010}.  Do you have a primary care provider? *** Do you feel safe at home? ***  Flowsheet Row Office Visit from 06/11/2022 in [facility] for [facility] at [facility] Total Score 1       Health Maintenance Due  Topic Date Due   HPV VACCINES (1 - 3-dose series) Never done   DTaP/Tdap/Td (1 - Tdap) Never done   COVID-19 Vaccine (1 - 2024-25 season) Never done   Cervical Cancer Screening (Pap smear)  09/24/2023     Risk factors for chronic health problems: Smoking: Alchohol/how much: Pt BMI: There is no height or weight on file to calculate BMI.   Gynecologic History No LMP recorded. Patient has had an implant. Contraception: Nexplanon . Placed 10/2022 Last Pap: 9/24221. Results were: abnormal with ASCUS, neg HPV.  Last mammogram: n/a.   Obstetric History OB History  Gravida Para Term Preterm AB Living  0 0 0 0 0 0  SAB IAB Ectopic Multiple Live Births  0 0 0 0 0     {Common ambulatory SmartLinks:19316}  Review of Systems {ros; complete:30496}    Objective:   There were no vitals filed for this visit. There is no height or weight on file to calculate BMI.  VS reviewed, nursing note reviewed,  Constitutional: well developed, well nourished, no distress HEENT: normocephalic, thyroid  without enlargement or mass HEART: RRR, no murmurs rubs/gallops RESP: clear and equal to auscultation bilaterally in all lobes  Breast Exam:  ***Deferred with low risks and shared decision making, discussed recommendation to start mammogram between 40-50 yo/ exam performed: right breast normal without mass, skin or nipple changes or axillary nodes, left breast normal without mass, skin or nipple changes or axillary nodes Abdomen: soft Neuro: alert and oriented x 3 Skin: warm, dry Psych: affect  normal Pelvic exam: ***Deferred/ Performed: Cervix pink, visually closed, without lesion, scant white creamy discharge, vaginal walls and external genitalia normal Bimanual exam: Cervix 0/long/high, firm, anterior, neg CMT, uterus nontender, nonenlarged, adnexa without tenderness, enlargement, or mass        Assessment/Plan:   1. Encounter for annual routine gynecological examination (Primary) ***  2. ASCUS of cervix with negative high risk HPV ***      No follow-ups on file.   Arlester Bence, CNM 1:29 PM

## 2024-05-13 DIAGNOSIS — Z0001 Encounter for general adult medical examination with abnormal findings: Secondary | ICD-10-CM | POA: Diagnosis not present

## 2024-05-13 DIAGNOSIS — H029 Unspecified disorder of eyelid: Secondary | ICD-10-CM | POA: Diagnosis not present

## 2024-05-27 ENCOUNTER — Ambulatory Visit: Admitting: Advanced Practice Midwife

## 2024-07-01 ENCOUNTER — Ambulatory Visit
Admission: EM | Admit: 2024-07-01 | Discharge: 2024-07-01 | Disposition: A | Discharge status: Home / Self Care | Attending: Emergency Medicine | Admitting: Emergency Medicine

## 2024-07-01 DIAGNOSIS — H00011 Hordeolum externum right upper eyelid: Secondary | ICD-10-CM | POA: Diagnosis not present

## 2024-07-01 MED ORDER — ERYTHROMYCIN 5 MG/GM OP OINT: TOPICAL_OINTMENT | OPHTHALMIC | 0 refills | Status: AC

## 2024-07-01 NOTE — Discharge Instructions (Addendum)
 Use the antibiotic eye ointment as prescribed.    Follow-up with an ophthalmologist such as the one listed below.  Go to the emergency department if you have worsening symptoms.

## 2024-07-01 NOTE — ED Provider Notes (Signed)
 Brianna Lewis    CSN: [id number] Arrival date & time: 07/01/24  1644      History   Chief Complaint Chief Complaint  Patient presents with   Eye Problem    HPI Brianna Lewis is a 28 y.o. female.  Patient presents with recurrent stye on her right upper eyelid x 2 months.  Current stye has been there for 2 days.  She states the area develops into a pustule and then drains but then comes back.  She has been treating it with warm compresses.  No eye injury, change in vision, eye pain, fever.  The history is provided by the patient and medical records.    Past Medical History:  Diagnosis Date   Anemia    Anxiety    Heart murmur    Hyperthyroidism    Pneumonia     Patient Active Problem List   Diagnosis Date Noted   Atypical squamous cells of undetermined significance (ASCUS) on Papanicolaou smear of cervix 09/27/2020   Breakthrough bleeding on Nexplanon  09/14/2019   Adnexal pain 09/14/2019    History reviewed. No pertinent surgical history.  OB History     Gravida  0   Para  0   Term  0   Preterm  0   AB  0   Living  0      SAB  0   IAB  0   Ectopic  0   Multiple  0   Live Births  0            Home Medications    Prior to Admission medications   Medication Sig Start Date End Date Taking? Authorizing Provider  erythromycin ophthalmic ointment Place a 1/2 inch ribbon of ointment onto the eyelid four times a day for 7 days. 07/01/24  Yes Corlis Burnard DEL, NP  albuterol  (VENTOLIN  HFA) 108 (90 Base) MCG/ACT inhaler Inhale 2 puffs into the lungs every 4 (four) hours as needed for wheezing or shortness of breath. Patient not taking: Reported on 03/10/2024 02/05/24   Herlinda Belton B, FNP  brompheniramine-pseudoephedrine-DM 30-2-10 MG/5ML syrup Take 5 mLs by mouth 4 (four) times daily as needed. Patient not taking: Reported on 03/10/2024 12/30/21   Saunders Givens L, PA-C  etonogestrel  (NEXPLANON ) 68 MG IMPL implant Inject into the skin. Patient not  taking: Reported on 03/10/2024    [provider]  fluconazole  (DIFLUCAN ) 150 MG tablet Take 1 tablet (150 mg total) by mouth as needed. Patient not taking: Reported on 03/10/2024 06/12/22   Milly Planas A, CNM  methimazole (TAPAZOLE) 5 MG tablet Take by mouth. Patient not taking: Reported on 03/10/2024 04/22/19   [provider]  metroNIDAZOLE  (FLAGYL ) 500 MG tablet Take 1 tablet (500 mg total) by mouth 2 (two) times daily. Patient not taking: Reported on 03/10/2024 04/30/23   Constant, Peggy, MD  norethindrone -ethinyl estradiol  1/35 (ORTHO-NOVUM) tablet Take 1 tablet by mouth daily. Patient not taking: Reported on 03/10/2024 12/27/22   Milly Planas A, CNM  predniSONE  (DELTASONE ) 10 MG tablet Take 5 tablets (50 mg total) by mouth daily. Patient not taking: Reported on 03/10/2024 02/05/24   Herlinda Belton B, FNP  terconazole  (TERAZOL 3 ) 0.8 % vaginal cream Place 1 applicator vaginally at bedtime. Patient not taking: Reported on 03/10/2024 07/06/21   Milly Planas A, CNM  terconazole  (TERAZOL 7 ) 0.4 % vaginal cream Place 1 applicator vaginally at bedtime. Patient not taking: Reported on 03/10/2024 04/18/22   Rudy Carlin LABOR, MD    Family  History Family History  Problem Relation Age of Onset   Diabetes Mother    Depression Mother    Hypertension Mother    Diabetes Maternal Grandmother    Arthritis Maternal Grandmother    Hypertension Maternal Grandmother    Stroke Maternal Grandmother     Social History Social History   Tobacco Use   Smoking status: Former    Current packs/day: 0.50    Types: Cigarettes   Smokeless tobacco: Never  Substance Use Topics   Alcohol use: Yes    Comment: socially   Drug use: Yes    Types: Marijuana     Allergies   Patient has no known allergies.   Review of Systems Review of Systems  Constitutional:  Negative for chills and fever.  Eyes:  Negative for pain and visual disturbance.       Stye on right upper  eyelid.     Physical Exam Triage Vital Signs ED Triage Vitals [07/01/24 1702]  Encounter Vitals Group     BP 115/76     Girls Systolic BP Percentile      Girls Diastolic BP Percentile      Boys Systolic BP Percentile      Boys Diastolic BP Percentile      Pulse Rate 87     Resp 18     Temp 97.9 F (36.6 C)     Temp src      SpO2 97 %     Weight      Height      Head Circumference      Peak Flow      Pain Score      Pain Loc      Pain Education      Exclude from Growth Chart    No data found.  Updated Vital Signs BP 115/76   Pulse 87   Temp 97.9 F (36.6 C)   Resp 18   LMP 06/21/2024 (Approximate)   SpO2 97%   Visual Acuity Right Eye Distance: 20/25 Left Eye Distance: 20/25 Bilateral Distance: 20/25 (wears glasses, does not have them with her.)  Right Eye Near:   Left Eye Near:    Bilateral Near:     Physical Exam Constitutional:      General: She is not in acute distress. HENT:     Mouth/Throat:     Mouth: Mucous membranes are moist.  Eyes:     General: Vision grossly intact.        Right eye: No discharge.        Left eye: No discharge.     Extraocular Movements: Extraocular movements intact.     Conjunctiva/sclera: Conjunctivae normal.     Right eye: Right conjunctiva is not injected.     Left eye: Left conjunctiva is not injected.     Pupils: Pupils are equal, round, and reactive to light.   Cardiovascular:     Rate and Rhythm: Normal rate and regular rhythm.  Pulmonary:     Effort: Pulmonary effort is normal. No respiratory distress.  Neurological:     Mental Status: She is alert.      UC Treatments / Results  Labs (all labs ordered are listed, but only abnormal results are displayed) Labs Reviewed - No data to display  EKG   Radiology No results found.  Procedures Procedures (including critical care time)  Medications Ordered in UC Medications - No data to display  Initial Impression / Assessment and Plan / UC Course  I  have reviewed the triage vital signs and the nursing notes.  Pertinent labs & imaging results that were available during my care of the patient were reviewed by me and considered in my medical decision making (see chart for details).    Right upper eyelid hordeolum.  Treating with erythromycin eye ointment.  Instructed patient to follow-up with an ophthalmologist as her stye has been recurrent for 2 months.  Contact information for on-call ophthalmology provided.  ED precautions given.  Education provided on stye.  Patient agrees to plan of care.  Final Clinical Impressions(s) / UC Diagnoses   Final diagnoses:  Hordeolum externum of right upper eyelid     Discharge Instructions      Use the antibiotic eye ointment as prescribed.    Follow-up with an ophthalmologist such as the one listed below.  Go to the emergency department if you have worsening symptoms.        ED Prescriptions     Medication Sig Dispense Auth. Provider   erythromycin ophthalmic ointment Place a 1/2 inch ribbon of ointment onto the eyelid four times a day for 7 days. 3.5 g Corlis Burnard DEL, NP      PDMP not reviewed this encounter.   Corlis Burnard DEL, NP 07/01/24 1736

## 2024-07-01 NOTE — ED Triage Notes (Signed)
 Pt c/o stye and eye irritation x 2 days. Pt states she has a stye on the upper eyelid on right eye x 2 months that comes and goes. Pt tried warm compresses with no relief of symptoms.

## 2024-11-05 ENCOUNTER — Ambulatory Visit
Admission: EM | Admit: 2024-11-05 | Discharge: 2024-11-05 | Disposition: A | Discharge status: Home / Self Care | Attending: Emergency Medicine | Admitting: Emergency Medicine

## 2024-11-05 DIAGNOSIS — J0100 Acute maxillary sinusitis, unspecified: Secondary | ICD-10-CM

## 2024-11-05 MED ORDER — AMOXICILLIN-POT CLAVULANATE 875-125 MG PO TABS: 1.0000 | ORAL_TABLET | Freq: Two times a day (BID) | ORAL | 0 refills | Status: AC

## 2024-11-05 NOTE — ED Triage Notes (Signed)
 Patient to Urgent Care with complaints of sore throat/ nasal and chest  congestion/ runny nose/ facial pain (lower right sided, concerned about a potential dental problem).   Symptoms x1 week.  Meds: ibuprofen/ [employer] pain relief/ benadryl/ nyquil/ dayquil.

## 2024-11-05 NOTE — Discharge Instructions (Addendum)
 Take the Augmentin as directed.  Follow up with your primary care provider if your symptoms are not improving.

## 2024-11-05 NOTE — ED Provider Notes (Signed)
 [name]    CSN: [id number] Arrival date & time: 11/05/24  1340      History   Chief Complaint Chief Complaint  Patient presents with   Sore Throat   Nasal Congestion   Facial Pain    HPI Brianna Lewis is a 28 y.o. female.  Patient presents with 1 week history of congestion, runny nose, postnasal drip, sinus pressure, sore throat, cough.  No fever, shortness of breath, vomiting, diarrhea.  Treatment attempted with OTC cold and sinus medications.    The history is provided by the patient and medical records.    Past Medical History:  Diagnosis Date   Anemia    Anxiety    Heart murmur    Hyperthyroidism    Pneumonia     Patient Active Problem List   Diagnosis Date Noted   Atypical squamous cells of undetermined significance (ASCUS) on Papanicolaou smear of cervix 09/27/2020   Breakthrough bleeding on Nexplanon  09/14/2019   Adnexal pain 09/14/2019    History reviewed. No pertinent surgical history.  OB History     Gravida  0   Para  0   Term  0   Preterm  0   AB  0   Living  0      SAB  0   IAB  0   Ectopic  0   Multiple  0   Live Births  0            Home Medications    Prior to Admission medications   Medication Sig Start Date End Date Taking? Authorizing Provider  amoxicillin-clavulanate (AUGMENTIN) 875-125 MG tablet Take 1 tablet by mouth every 12 (twelve) hours. 11/05/24  Yes Corlis Burnard DEL, NP  albuterol  (VENTOLIN  HFA) 108 (90 Base) MCG/ACT inhaler Inhale 2 puffs into the lungs every 4 (four) hours as needed for wheezing or shortness of breath. Patient not taking: Reported on 03/10/2024 02/05/24   Herlinda Belton B, FNP  brompheniramine-pseudoephedrine-DM 30-2-10 MG/5ML syrup Take 5 mLs by mouth 4 (four) times daily as needed. Patient not taking: Reported on 03/10/2024 12/30/21   Saunders Shona CROME, PA-C  erythromycin  ophthalmic ointment Place a 1/2 inch ribbon of ointment onto the eyelid four times a day for 7 days. Patient  not taking: Reported on 11/05/2024 07/01/24   Corlis Burnard DEL, NP  etonogestrel  (NEXPLANON ) 68 MG IMPL implant Inject into the skin. Patient not taking: Reported on 03/10/2024    [provider]  fluconazole  (DIFLUCAN ) 150 MG tablet Take 1 tablet (150 mg total) by mouth as needed. Patient not taking: Reported on 03/10/2024 06/12/22   Milly Planas A, CNM  methimazole (TAPAZOLE) 5 MG tablet Take by mouth. Patient not taking: Reported on 03/10/2024 04/22/19   [provider]  metroNIDAZOLE  (FLAGYL ) 500 MG tablet Take 1 tablet (500 mg total) by mouth 2 (two) times daily. Patient not taking: Reported on 03/10/2024 04/30/23   Constant, Peggy, MD  norethindrone -ethinyl estradiol  1/35 (ORTHO-NOVUM) tablet Take 1 tablet by mouth daily. Patient not taking: Reported on 03/10/2024 12/27/22   Milly Planas LABOR, CNM  predniSONE  (DELTASONE ) 10 MG tablet Take 5 tablets (50 mg total) by mouth daily. Patient not taking: Reported on 03/10/2024 02/05/24   Herlinda Belton B, FNP  terconazole  (TERAZOL 3 ) 0.8 % vaginal cream Place 1 applicator vaginally at bedtime. Patient not taking: Reported on 03/10/2024 07/06/21   Milly Planas A, CNM  terconazole  (TERAZOL 7 ) 0.4 % vaginal cream Place 1 applicator vaginally at bedtime. Patient not  taking: Reported on 03/10/2024 04/18/22   Rudy Carlin LABOR, MD    Family History Family History  Problem Relation Age of Onset   Diabetes Mother    Depression Mother    Hypertension Mother    Diabetes Maternal Grandmother    Arthritis Maternal Grandmother    Hypertension Maternal Grandmother    Stroke Maternal Grandmother     Social History Social History   Tobacco Use   Smoking status: Former    Current packs/day: 0.50    Types: Cigarettes   Smokeless tobacco: Never  Substance Use Topics   Alcohol use: Yes    Comment: socially   Drug use: Yes    Types: Marijuana     Allergies   Patient has no known allergies.   Review of Systems Review of  Systems  Constitutional:  Negative for chills and fever.  HENT:  Positive for congestion, postnasal drip, rhinorrhea, sinus pressure and sore throat. Negative for ear pain.   Respiratory:  Positive for cough. Negative for shortness of breath.   Gastrointestinal:  Negative for diarrhea and vomiting.     Physical Exam Triage Vital Signs ED Triage Vitals [11/05/24 1413]  Encounter Vitals Group     BP      Girls Systolic BP Percentile      Girls Diastolic BP Percentile      Boys Systolic BP Percentile      Boys Diastolic BP Percentile      Pulse      Resp      Temp      Temp src      SpO2      Weight      Height      Head Circumference      Peak Flow      Pain Score 2     Pain Loc      Pain Education      Exclude from Growth Chart    No data found.  Updated Vital Signs BP 127/68   Pulse 95   Temp 98 F (36.7 C)   Resp 19   SpO2 100%   Visual Acuity Right Eye Distance:   Left Eye Distance:   Bilateral Distance:    Right Eye Near:   Left Eye Near:    Bilateral Near:     Physical Exam Constitutional:      General: She is not in acute distress. HENT:     Right Ear: Tympanic membrane normal.     Left Ear: Tympanic membrane normal.     Nose: Congestion and rhinorrhea present.     Mouth/Throat:     Mouth: Mucous membranes are moist.     Pharynx: Oropharynx is clear.  Cardiovascular:     Rate and Rhythm: Normal rate and regular rhythm.     Heart sounds: Normal heart sounds.  Pulmonary:     Effort: Pulmonary effort is normal. No respiratory distress.     Breath sounds: Normal breath sounds.  Neurological:     Mental Status: She is alert.      UC Treatments / Results  Labs (all labs ordered are listed, but only abnormal results are displayed) Labs Reviewed - No data to display  EKG   Radiology No results found.  Procedures Procedures (including critical care time)  Medications Ordered in UC Medications - No data to display  Initial Impression  / Assessment and Plan / UC Course  I have reviewed the triage vital signs and the nursing notes.  Pertinent labs & imaging results that were available during my care of the patient were reviewed by me and considered in my medical decision making (see chart for details).    Acute sinusitis.  Afebrile and vital signs are stable.  Lungs are clear and O2 sat is 100% on room air.  Patient has been symptomatic for a week and is not improving with OTC treatment.  Treating today with Augmentin.  Tylenol or ibuprofen as needed.  Plain Mucinex as needed.  Instructed patient to follow up with her PCP if her symptoms are not improving.  She agrees to plan of care.   Final Clinical Impressions(s) / UC Diagnoses   Final diagnoses:  Acute non-recurrent maxillary sinusitis     Discharge Instructions      Take the Augmentin as directed.  Follow-up with your primary care provider if your symptoms are not improving.      ED Prescriptions     Medication Sig Dispense Auth. Provider   amoxicillin-clavulanate (AUGMENTIN) 875-125 MG tablet Take 1 tablet by mouth every 12 (twelve) hours. 14 tablet Corlis Burnard DEL, NP      PDMP not reviewed this encounter.   Corlis Burnard DEL, NP 11/05/24 1504

## 2024-11-12 ENCOUNTER — Encounter: Payer: Self-pay | Admitting: Emergency Medicine

## 2024-11-12 ENCOUNTER — Ambulatory Visit
Admission: EM | Admit: 2024-11-12 | Discharge: 2024-11-12 | Disposition: A | Discharge status: Home / Self Care | Attending: Emergency Medicine | Admitting: Emergency Medicine

## 2024-11-12 DIAGNOSIS — R21 Rash and other nonspecific skin eruption: Secondary | ICD-10-CM

## 2024-11-12 MED ORDER — TRIAMCINOLONE ACETONIDE 0.1 % EX CREA: 1.0000 | TOPICAL_CREAM | Freq: Two times a day (BID) | CUTANEOUS | 1 refills | Status: AC

## 2024-11-12 NOTE — ED Triage Notes (Signed)
 Patient reports red itchy rash all over body that started yesterday. Patient took Ibuprofen and and applied Vaseline with no relief. Patient reports that she was place on Amoxicillin and started on 11/06/24.  Patient stopped taking antibiotics  yesterday and unsure if antibiotics is contributed to rash.

## 2024-11-12 NOTE — Discharge Instructions (Addendum)
 Today you are evaluated for your rash which is most consistent with an inflammatory response to something that your skin is coming contact with it is possible that it is related to recent antibiotic use and therefore the antibiotic has been listed on your chart as a potential allergy, also could be related to possible exposure from the CLR cleaning product, would need further testing to determine all potential allergies and if you are interested in that you have been given information to the specialist whose information is on front page  To help stop any potential inflammatory response begin use of Benadryl taking once daily, may also use Claritin or Zyrtec if Benadryl is causing you to become drowsy  May apply triamcinolone cream over the affected areas twice daily until clear  Please be mindful that heat will cause skin to feel more irritated and inflamed and if this occurs cool skin back female  To further soothe the skin may apply additional topical medicines such as Benadryl cream, calamine lotion and use oatmeal soaps or baths  Change her loofah as it potentially has cleaning products on it  Clean over the shower head with soap and water to prevent any further residual cleaning product from getting onto your skin  If you have not seen any improvement in your symptoms within 3 to 4 days or at any point if your symptoms worsen you may follow-up for reevaluation here at the urgent care

## 2024-11-12 NOTE — ED Provider Notes (Signed)
 Brianna Lewis    CSN: [id number] Arrival date & time: 11/12/24  1544      History   Chief Complaint Chief Complaint  Patient presents with   Rash    HPI Brianna Lewis is a 28 y.o. female.   Patient presents for evaluation of erythematous rash generalized to the body beginning 1 day ago.  Rash is pruritic but denies presence of drainage or pain.  Endorses she has been taking Augmentin over the past 5 days for treatment of a sinus infection but stopped once rash began if she is unsure if it is an allergic reaction.  Also prior to symptoms beginning she had exposure to calcium Lowne breast remover that was used to clean the showerhead, possible cleaner got into her loose foot which was underneath the showerhead.  Has attempted use of ibuprofen which has provided no relief.  Denies changes in toiletries diet recent travel, no close contact has similar symptoms.  \   Past Medical History:  Diagnosis Date   Anemia    Anxiety    Heart murmur    Hyperthyroidism    Pneumonia     Patient Active Problem List   Diagnosis Date Noted   Atypical squamous cells of undetermined significance (ASCUS) on Papanicolaou smear of cervix 09/27/2020   Breakthrough bleeding on Nexplanon  09/14/2019   Adnexal pain 09/14/2019    History reviewed. No pertinent surgical history.  OB History     Gravida  0   Para  0   Term  0   Preterm  0   AB  0   Living  0      SAB  0   IAB  0   Ectopic  0   Multiple  0   Live Births  0            Home Medications    Prior to Admission medications   Medication Sig Start Date End Date Taking? Authorizing Provider  albuterol  (VENTOLIN  HFA) 108 (90 Base) MCG/ACT inhaler Inhale 2 puffs into the lungs every 4 (four) hours as needed for wheezing or shortness of breath. Patient not taking: Reported on 03/10/2024 02/05/24   Triplett, Cari B, FNP  amoxicillin-clavulanate (AUGMENTIN) 875-125 MG tablet Take 1 tablet by mouth every 12  (twelve) hours. 11/05/24   Corlis Burnard DEL, NP  brompheniramine-pseudoephedrine-DM 30-2-10 MG/5ML syrup Take 5 mLs by mouth 4 (four) times daily as needed. Patient not taking: Reported on 03/10/2024 12/30/21   Saunders Shona CROME, PA-C  erythromycin  ophthalmic ointment Place a 1/2 inch ribbon of ointment onto the eyelid four times a day for 7 days. Patient not taking: Reported on 11/05/2024 07/01/24   Corlis Burnard DEL, NP  etonogestrel  (NEXPLANON ) 68 MG IMPL implant Inject into the skin. Patient not taking: Reported on 03/10/2024    [provider]  fluconazole  (DIFLUCAN ) 150 MG tablet Take 1 tablet (150 mg total) by mouth as needed. Patient not taking: Reported on 03/10/2024 06/12/22   Milly Planas A, CNM  methimazole (TAPAZOLE) 5 MG tablet Take by mouth. Patient not taking: Reported on 03/10/2024 04/22/19   [provider]  metroNIDAZOLE  (FLAGYL ) 500 MG tablet Take 1 tablet (500 mg total) by mouth 2 (two) times daily. Patient not taking: Reported on 03/10/2024 04/30/23   Constant, Peggy, MD  norethindrone -ethinyl estradiol  1/35 (ORTHO-NOVUM) tablet Take 1 tablet by mouth daily. Patient not taking: Reported on 03/10/2024 12/27/22   Leftwich-Kirby, Planas A, CNM  predniSONE  (DELTASONE ) 10 MG tablet Take 5  tablets (50 mg total) by mouth daily. Patient not taking: Reported on 03/10/2024 02/05/24   Herlinda Belton B, FNP  terconazole  (TERAZOL 3 ) 0.8 % vaginal cream Place 1 applicator vaginally at bedtime. Patient not taking: Reported on 03/10/2024 07/06/21   Milly Planas A, CNM  terconazole  (TERAZOL 7 ) 0.4 % vaginal cream Place 1 applicator vaginally at bedtime. Patient not taking: Reported on 03/10/2024 04/18/22   Rudy Carlin LABOR, MD    Family History Family History  Problem Relation Age of Onset   Diabetes Mother    Depression Mother    Hypertension Mother    Diabetes Maternal Grandmother    Arthritis Maternal Grandmother    Hypertension Maternal Grandmother    Stroke Maternal  Grandmother     Social History Social History   Tobacco Use   Smoking status: Former    Current packs/day: 0.50    Types: Cigarettes   Smokeless tobacco: Never  Substance Use Topics   Alcohol use: Yes    Comment: socially   Drug use: Yes    Types: Marijuana     Allergies   Patient has no known allergies.   Review of Systems Review of Systems  Skin:  Positive for rash.     Physical Exam Triage Vital Signs ED Triage Vitals  Encounter Vitals Group     BP 11/12/24 1643 120/75     Girls Systolic BP Percentile --      Girls Diastolic BP Percentile --      Boys Systolic BP Percentile --      Boys Diastolic BP Percentile --      Pulse Rate 11/12/24 1643 76     Resp 11/12/24 1643 18     Temp 11/12/24 1643 98.7 F (37.1 C)     Temp Source 11/12/24 1643 Oral     SpO2 11/12/24 1643 100 %     Weight --      Height --      Head Circumference --      Peak Flow --      Pain Score 11/12/24 1648 0     Pain Loc --      Pain Education --      Exclude from Growth Chart --    No data found.  Updated Vital Signs BP 120/75 (BP Location: Right Arm)   Pulse 76   Temp 98.7 F (37.1 C) (Oral)   Resp 18   LMP 10/15/2024 (Approximate)   SpO2 100%   Visual Acuity Right Eye Distance:   Left Eye Distance:   Bilateral Distance:    Right Eye Near:   Left Eye Near:    Bilateral Near:     Physical Exam Constitutional:      Appearance: Normal appearance.  Eyes:     Extraocular Movements: Extraocular movements intact.  Pulmonary:     Effort: Pulmonary effort is normal.  Skin:    Comments: Fine erythematous papular rash scattered over the body  Neurological:     Mental Status: She is alert and oriented to person, place, and time.      UC Treatments / Results  Labs (all labs ordered are listed, but only abnormal results are displayed) Labs Reviewed - No data to display  EKG   Radiology No results found.  Procedures Procedures (including critical care  time)  Medications Ordered in UC Medications - No data to display  Initial Impression / Assessment and Plan / UC Course  I have reviewed the triage vital signs and  the nursing notes.  Pertinent labs & imaging results that were available during my care of the patient were reviewed by me and considered in my medical decision making (see chart for details).  Rash  Most consistent with inflammatory process, discussed, unable to distinguish if reaction to medication versus chemical exposure, discussed this with patient, medicine listed as potential allergy on chart and advised to discontinuation, patient declined additional medication for treatment of sinusitis declined oral steroids for treatment, recommended over-the-counter antihistamines and prescribed topical Kenalog cream and discussed administration, recommended nonpharmacological measures and advised follow-up if symptoms continue to persist Final Clinical Impressions(s) / UC Diagnoses   Final diagnoses:  None   Discharge Instructions   None    ED Prescriptions   None    PDMP not reviewed this encounter.   Teresa Shelba SAUNDERS, NP 11/12/24 [phone]

## 2025-01-16 ENCOUNTER — Other Ambulatory Visit: Payer: Self-pay

## 2025-01-16 ENCOUNTER — Emergency Department: Admission: EM | Admit: 2025-01-16 | Discharge: 2025-01-16 | Disposition: A | Discharge status: Home / Self Care

## 2025-01-16 DIAGNOSIS — E039 Hypothyroidism, unspecified: Secondary | ICD-10-CM | POA: Diagnosis not present

## 2025-01-16 DIAGNOSIS — R1013 Epigastric pain: Secondary | ICD-10-CM | POA: Insufficient documentation

## 2025-01-16 DIAGNOSIS — R110 Nausea: Secondary | ICD-10-CM | POA: Insufficient documentation

## 2025-01-16 LAB — URINALYSIS, ROUTINE W REFLEX MICROSCOPIC
Bilirubin Urine: NEGATIVE
Glucose, UA: NEGATIVE mg/dL
Hgb urine dipstick: NEGATIVE
Ketones, ur: 5 mg/dL — AB
Leukocytes,Ua: NEGATIVE
Nitrite: NEGATIVE
Protein, ur: 30 mg/dL — AB
RBC / HPF: 0 RBC/hpf (ref 0–5)
Specific Gravity, Urine: 1.017 (ref 1.005–1.030)
pH: 6 (ref 5.0–8.0)

## 2025-01-16 LAB — CBC
HCT: 30.4 % — ABNORMAL LOW (ref 36.0–46.0)
Hemoglobin: 9.1 g/dL — ABNORMAL LOW (ref 12.0–15.0)
MCH: 24.3 pg — ABNORMAL LOW (ref 26.0–34.0)
MCHC: 29.9 g/dL — ABNORMAL LOW (ref 30.0–36.0)
MCV: 81.3 fL (ref 80.0–100.0)
Platelets: 335 K/uL (ref 150–400)
RBC: 3.74 MIL/uL — ABNORMAL LOW (ref 3.87–5.11)
RDW: 16.1 % — ABNORMAL HIGH (ref 11.5–15.5)
WBC: 5.9 K/uL (ref 4.0–10.5)
nRBC: 0 % (ref 0.0–0.2)

## 2025-01-16 LAB — COMPREHENSIVE METABOLIC PANEL WITH GFR
ALT: 13 U/L (ref 0–44)
AST: 26 U/L (ref 15–41)
Albumin: 4.8 g/dL (ref 3.5–5.0)
Alkaline Phosphatase: 42 U/L (ref 38–126)
Anion gap: 11 (ref 5–15)
BUN: 12 mg/dL (ref 6–20)
CO2: 25 mmol/L (ref 22–32)
Calcium: 9.3 mg/dL (ref 8.9–10.3)
Chloride: 104 mmol/L (ref 98–111)
Creatinine, Ser: 0.77 mg/dL (ref 0.44–1.00)
GFR, Estimated: >60 mL/min
Glucose, Bld: 82 mg/dL (ref 70–99)
Potassium: 3.4 mmol/L — ABNORMAL LOW (ref 3.5–5.1)
Sodium: 139 mmol/L (ref 135–145)
Total Bilirubin: 0.6 mg/dL (ref 0.0–1.2)
Total Protein: 8.1 g/dL (ref 6.5–8.1)

## 2025-01-16 LAB — PREGNANCY, URINE: Preg Test, Ur: NEGATIVE

## 2025-01-16 LAB — LIPASE, BLOOD: Lipase: 41 U/L (ref 11–51)

## 2025-01-16 MED ORDER — ONDANSETRON 4 MG PO TBDP
4.0000 mg | ORAL_TABLET | Freq: Once | ORAL | Status: AC
  Administered 2025-01-16: 4 mg via ORAL
  Filled 2025-01-16: qty 1

## 2025-01-16 MED ORDER — ALUM & MAG HYDROXIDE-SIMETH 200-200-20 MG/5ML PO SUSP
30.0000 mL | Freq: Once | ORAL | Status: AC
  Administered 2025-01-16: 30 mL via ORAL
  Filled 2025-01-16: qty 30

## 2025-01-16 MED ORDER — LIDOCAINE VISCOUS HCL 2 % MT SOLN
15.0000 mL | Freq: Once | OROMUCOSAL | Status: AC
  Administered 2025-01-16: 15 mL via ORAL
  Filled 2025-01-16: qty 15

## 2025-01-16 MED ORDER — FAMOTIDINE 20 MG PO TABS
20.0000 mg | ORAL_TABLET | Freq: Once | ORAL | Status: AC
  Administered 2025-01-16: 20 mg via ORAL
  Filled 2025-01-16: qty 1

## 2025-01-16 MED ORDER — FAMOTIDINE 20 MG PO TABS: 20.0000 mg | ORAL_TABLET | Freq: Two times a day (BID) | ORAL | 0 refills | Status: AC

## 2025-01-16 MED ORDER — ONDANSETRON 4 MG PO TBDP: 4.0000 mg | ORAL_TABLET | Freq: Three times a day (TID) | ORAL | 0 refills | Status: AC | PRN

## 2025-01-16 NOTE — Discharge Instructions (Addendum)
 Your evaluation in the emergency department was overall reassuring.  I do suspect you likely have a reaction called cannabinoid hyperemesis syndrome, and I provided you more information on this.  I strongly suggest you stop smoking marijuana and see if this helps with your symptoms.  I also prescribed you an antacid medication and a nausea medication.  Please do follow-up with your primary care provider for reevaluation (or I provided you information for the [facility] clinic here to try to schedule appointment), and return to the emergency department with any new or worsening symptoms.  Take care.

## 2025-01-16 NOTE — ED Triage Notes (Signed)
 Pt reports mid abd pain that wraps around ongoing for the past year. Concerned today d/t increase in frequency. Associated with nausea.

## 2025-01-16 NOTE — ED Provider Notes (Signed)
 "  [facility] Main Provider Note    Event Date/Time   First MD Initiated Contact with Patient 01/16/25 1955     (approximate)   History   Abdominal Pain  Pt reports mid abd pain that wraps around ongoing for the past year. Concerned today d/t increase in frequency. Associated with nausea.    HPI Brianna Lewis is a 29 y.o. female PMH anxiety, hypothyroidism, anemia presents for evaluation of abdominal pain - Patient notes she has intermittent episodes of upper abdominal pain that feels like gas and nausea.  No vomiting.  No fever.  Presents today due to increased frequency of these episodes.  No lower abdominal pain, no urinary symptoms. -Has been having similar episodes over the past 6 to 8 months - No chest pain - No abdominal surgical history - Rare alcohol use - Daily marijuana use - Currently asymptomatic, symptoms from earlier in the day resolved - Is currently on her menstrual cycle, episodes do not correlate with timing of menstrual cycle      Physical Exam   Triage Vital Signs: ED Triage Vitals [01/16/25 1917]  Encounter Vitals Group     BP 120/81     Girls Systolic BP Percentile      Girls Diastolic BP Percentile      Boys Systolic BP Percentile      Boys Diastolic BP Percentile      Pulse Rate 77     Resp 17     Temp 98.5 F (36.9 C)     Temp Source Oral     SpO2 100 %     Weight 139 lb 5.3 oz (63.2 kg)     Height 5' 4 (1.626 m)     Head Circumference      Peak Flow      Pain Score 2     Pain Loc      Pain Education      Exclude from Growth Chart     Most recent vital signs: Vitals:   01/16/25 1917 01/16/25 1958  BP: 120/81   Pulse: 77   Resp: 17   Temp: 98.5 F (36.9 C)   SpO2: 100% 100%     General: Awake, no distress.  CV:  Good peripheral perfusion. RRR, RP 2+ Resp:  Normal effort. CTAB Abd:  No distention. Nontender to deep palpation throughout Other:     ED Results / Procedures / Treatments   Labs (all  labs ordered are listed, but only abnormal results are displayed) Labs Reviewed  COMPREHENSIVE METABOLIC PANEL WITH GFR - Abnormal; Notable for the following components:      Result Value   Potassium 3.4 (*)    All other components within normal limits  CBC - Abnormal; Notable for the following components:   RBC 3.74 (*)    Hemoglobin 9.1 (*)    HCT 30.4 (*)    MCH 24.3 (*)    MCHC 29.9 (*)    RDW 16.1 (*)    All other components within normal limits  URINALYSIS, ROUTINE W REFLEX MICROSCOPIC - Abnormal; Notable for the following components:   Color, Urine YELLOW (*)    APPearance HAZY (*)    Ketones, ur 5 (*)    Protein, ur 30 (*)    Bacteria, UA RARE (*)    All other components within normal limits  LIPASE, BLOOD  PREGNANCY, URINE  POC URINE PREG, ED     EKG  N/a   RADIOLOGY N/a  PROCEDURES:  Critical Care performed: No  Procedures   MEDICATIONS ORDERED IN ED: Medications  alum & mag hydroxide-simeth (MAALOX/MYLANTA) 200-200-20 MG/5ML suspension 30 mL (30 mLs Oral Given 01/16/25 2108)    And  lidocaine  (XYLOCAINE ) 2 % viscous mouth solution 15 mL (15 mLs Oral Given 01/16/25 2108)  ondansetron  (ZOFRAN -ODT) disintegrating tablet 4 mg (4 mg Oral Given 01/16/25 2109)  famotidine  (PEPCID ) tablet 20 mg (20 mg Oral Given 01/16/25 2109)     IMPRESSION / MDM / ASSESSMENT AND PLAN / ED COURSE  I reviewed the triage vital signs and the nursing notes.                              DDX/MDM/AP: Differential diagnosis includes, but is not limited to, high suspicion for cannabis hyperemesis, consider gastritis/dyspepsia, consider viral syndrome, considered but doubt acute upper abdominal pathology including cholecystitis, pancreatitis, hepatitis.  Do not suspect lower abdominal pathology.  Plan: - Labs - No indication for imaging at this time - Discussed my concern for cannabis hyperemesis to which patient was very receptive.  Counseled on cannabis cessation for which  she is motivated.  Will also trial on famotidine  and Zofran  and plan for PMD follow-up.  If not improving, can consider outpatient referral to GI as needed.  Patient's presentation is most consistent with acute presentation with potential threat to life or bodily function.  ED course below.  Laboratory workup unremarkable.  Serial abdominal exams with no tenderness to palpation throughout.  Do suspect some element of cannabis hyperemesis, also consider possible dyspepsia.  Will give a dose of GI cocktail here and plan for famotidine  and Zofran  as needed in the outpatient setting as well as marijuana cessation.  Plan for PMD follow-up.  ED return precautions placed.  Patient agrees with plan.  No concern for acute underlying pathology at this time.  Clinical Course as of 01/16/25 2113  Sat Jan 16, 2025  2032 CBC reviewed, no leukocytosis, mild anemia, overall stable from prior (hemoglobin 10.0 in 2023).  Platelets normal.  CMP with mild hypokalemia, otherwise unremarkable  Urinalysis with some ketonuria though otherwise unremarkable.  No clear evidence of infection. [MM]  2100 Hcg neg [MM]    Clinical Course User Index [MM] Clarine Ozell LABOR, MD     FINAL CLINICAL IMPRESSION(S) / ED DIAGNOSES   Final diagnoses:  Epigastric pain  Nausea     Rx / DC Orders   ED Discharge Orders          Ordered    famotidine  (PEPCID ) 20 MG tablet  2 times daily        01/16/25 2113    ondansetron  (ZOFRAN -ODT) 4 MG disintegrating tablet  Every 8 hours PRN        01/16/25 2113             Note:  This document was prepared using Dragon voice recognition software and may include unintentional dictation errors.   Clarine Ozell LABOR, MD 01/16/25 2113  "
